# Patient Record
Sex: Female | Born: 1955 | Hispanic: No | Marital: Single | Smoking: Current every day smoker
Health system: Southern US, Community
[De-identification: ages and names within clinical notes are randomized; demographics above are authoritative.]

## PROBLEM LIST (undated history)

## (undated) DIAGNOSIS — G8929 Other chronic pain: Secondary | ICD-10-CM

## (undated) DIAGNOSIS — I10 Essential (primary) hypertension: Secondary | ICD-10-CM

## (undated) DIAGNOSIS — M549 Dorsalgia, unspecified: Secondary | ICD-10-CM

## (undated) DIAGNOSIS — I2510 Atherosclerotic heart disease of native coronary artery without angina pectoris: Secondary | ICD-10-CM

## (undated) DIAGNOSIS — E785 Hyperlipidemia, unspecified: Secondary | ICD-10-CM

## (undated) DIAGNOSIS — F329 Major depressive disorder, single episode, unspecified: Secondary | ICD-10-CM

## (undated) DIAGNOSIS — F32A Depression, unspecified: Secondary | ICD-10-CM

## (undated) DIAGNOSIS — I700 Atherosclerosis of aorta: Secondary | ICD-10-CM

## (undated) DIAGNOSIS — R7303 Prediabetes: Secondary | ICD-10-CM

## (undated) HISTORY — DX: Atherosclerotic heart disease of native coronary artery without angina pectoris: I25.10

## (undated) HISTORY — PX: ABDOMINAL HYSTERECTOMY: SHX81

## (undated) HISTORY — PX: WRIST FRACTURE SURGERY: SHX121

## (undated) HISTORY — DX: Prediabetes: R73.03

## (undated) HISTORY — DX: Atherosclerosis of aorta: I70.0

## (undated) HISTORY — DX: Hyperlipidemia, unspecified: E78.5

---

## 1998-09-19 ENCOUNTER — Other Ambulatory Visit: Admission: RE | Admit: 1998-09-19 | Discharge: 1998-09-19 | Payer: Self-pay | Admitting: Obstetrics and Gynecology

## 1999-12-13 ENCOUNTER — Other Ambulatory Visit: Admission: RE | Admit: 1999-12-13 | Discharge: 1999-12-13 | Payer: Self-pay | Admitting: Obstetrics and Gynecology

## 2000-01-11 ENCOUNTER — Encounter: Payer: Self-pay | Admitting: Obstetrics and Gynecology

## 2000-01-15 ENCOUNTER — Inpatient Hospital Stay (HOSPITAL_COMMUNITY): Admission: RE | Admit: 2000-01-15 | Discharge: 2000-01-18 | Payer: Self-pay | Admitting: Obstetrics and Gynecology

## 2000-01-15 ENCOUNTER — Encounter (INDEPENDENT_AMBULATORY_CARE_PROVIDER_SITE_OTHER): Payer: Self-pay | Admitting: Specialist

## 2000-07-14 ENCOUNTER — Emergency Department (HOSPITAL_COMMUNITY): Admission: EM | Admit: 2000-07-14 | Discharge: 2000-07-14 | Payer: Self-pay | Admitting: Emergency Medicine

## 2000-07-17 ENCOUNTER — Encounter: Admission: RE | Admit: 2000-07-17 | Discharge: 2000-07-17 | Payer: Self-pay | Admitting: Internal Medicine

## 2000-07-17 ENCOUNTER — Encounter: Payer: Self-pay | Admitting: Internal Medicine

## 2002-01-01 ENCOUNTER — Ambulatory Visit (HOSPITAL_COMMUNITY): Admission: RE | Admit: 2002-01-01 | Discharge: 2002-01-01 | Payer: Self-pay | Admitting: Gastroenterology

## 2004-05-12 ENCOUNTER — Emergency Department (HOSPITAL_COMMUNITY): Admission: EM | Admit: 2004-05-12 | Discharge: 2004-05-12 | Payer: Self-pay | Admitting: Family Medicine

## 2004-05-21 ENCOUNTER — Emergency Department (HOSPITAL_COMMUNITY): Admission: EM | Admit: 2004-05-21 | Discharge: 2004-05-21 | Payer: Self-pay | Admitting: Family Medicine

## 2005-01-22 ENCOUNTER — Emergency Department (HOSPITAL_COMMUNITY): Admission: EM | Admit: 2005-01-22 | Discharge: 2005-01-22 | Payer: Self-pay | Admitting: Family Medicine

## 2005-08-29 ENCOUNTER — Emergency Department (HOSPITAL_COMMUNITY): Admission: EM | Admit: 2005-08-29 | Discharge: 2005-08-29 | Payer: Self-pay | Admitting: Family Medicine

## 2006-01-28 ENCOUNTER — Ambulatory Visit: Payer: Self-pay | Admitting: Internal Medicine

## 2006-09-05 ENCOUNTER — Encounter: Admission: RE | Admit: 2006-09-05 | Discharge: 2006-09-05 | Payer: Self-pay | Admitting: General Practice

## 2007-03-05 ENCOUNTER — Emergency Department: Payer: Self-pay | Admitting: Emergency Medicine

## 2007-09-30 ENCOUNTER — Emergency Department (HOSPITAL_COMMUNITY): Admission: EM | Admit: 2007-09-30 | Discharge: 2007-09-30 | Payer: Self-pay | Admitting: Family Medicine

## 2008-01-23 ENCOUNTER — Encounter: Admission: RE | Admit: 2008-01-23 | Discharge: 2008-01-23 | Payer: Self-pay | Admitting: Internal Medicine

## 2008-11-16 ENCOUNTER — Emergency Department (HOSPITAL_COMMUNITY): Admission: EM | Admit: 2008-11-16 | Discharge: 2008-11-17 | Payer: Self-pay | Admitting: Emergency Medicine

## 2009-02-02 ENCOUNTER — Encounter: Admission: RE | Admit: 2009-02-02 | Discharge: 2009-02-02 | Payer: Self-pay | Admitting: Neurosurgery

## 2009-07-04 ENCOUNTER — Emergency Department (HOSPITAL_COMMUNITY): Admission: EM | Admit: 2009-07-04 | Discharge: 2009-07-04 | Payer: Self-pay | Admitting: Emergency Medicine

## 2010-06-25 ENCOUNTER — Encounter: Payer: Self-pay | Admitting: Internal Medicine

## 2010-08-24 ENCOUNTER — Emergency Department (HOSPITAL_COMMUNITY)
Admission: EM | Admit: 2010-08-24 | Discharge: 2010-08-24 | Disposition: A | Discharge status: Home / Self Care | Payer: 59 | Attending: Emergency Medicine | Admitting: Emergency Medicine

## 2010-08-24 DIAGNOSIS — K0510 Chronic gingivitis, plaque induced: Secondary | ICD-10-CM | POA: Insufficient documentation

## 2010-08-24 DIAGNOSIS — I10 Essential (primary) hypertension: Secondary | ICD-10-CM | POA: Insufficient documentation

## 2010-08-24 DIAGNOSIS — K089 Disorder of teeth and supporting structures, unspecified: Secondary | ICD-10-CM | POA: Insufficient documentation

## 2010-08-24 DIAGNOSIS — F17200 Nicotine dependence, unspecified, uncomplicated: Secondary | ICD-10-CM | POA: Insufficient documentation

## 2010-08-24 DIAGNOSIS — R599 Enlarged lymph nodes, unspecified: Secondary | ICD-10-CM | POA: Insufficient documentation

## 2010-10-20 NOTE — Op Note (Signed)
[facility]  Patient:    Judy Nguyen, Judy Nguyen                 MRN: [id number] Attending:  Rande Brunt. Eda Paschal, M.D.                           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  HISTORY OF PRESENT ILLNESS:  The patient is a 55 year old gravida 2, para 1, AB2 with a long progressive history of severe dysmenorrhea and menorrhagia. In addition to the above, she is also having pain in the right lower quadrant which appears to be related to her myomas.  She says that she notices the pain on the right depending on the position she lays in in bed.  She has undergone sonohystogram which reveals multiple leiomyoma uteri.  However, with nothing in the cavity such as a submucous myoma contributing to the above.  She had a GI workup by Dr. Ritta Slot which did not help elucidate the source of her pain.  She has started oral contraceptives in the past with some relief, but she is a smoker and obviously cannot stand oral contraceptives at this point. She has had a previous tubal ligation.  Therefore, she does not desire future pregnancy.  She is using Percocet and Darvocet-N 100 without much success from the above.  As a result of the myomas which enlarge her uterus to three times normal size, especially with the dysmenorrhea and menorrhagia, she now enters the hospital for total abdominal hysterectomy. The patient has given me permission to remove one or both ovaries for significant disease.  At one point, she had talked about having bilateral salpingo-oophorectomy to prevent reoperation for additional ovarian disease or ovarian cancer but, after a long discussion the day before the surgery, she has elected to only have her ovaries removed for significant disease, understanding that there is some risk of leaving ______ ovarian cancer in the future, but understanding the considerable benefit at 43 of  preserving her own ovaries and the fact that hormone replacement does not always resolve symptoms, and also may not be as protective as her own ovaries in terms of cardiovascular disease.  PAST MEDICAL HISTORY: 1. The patient has had previous tubal ligation. 2. History of ulcers.  CURRENT MEDICATIONS:  Percocet and nonsteroidals as well as Darvocet-N 100.  ALLERGIES:  SULFA.  FAMILY HISTORY:  Her mother is diabetic.  Her mother and father are both hypertensive.  SOCIAL HISTORY:  She is a smoker.  She uses alcohol occasionally.  She also drinks caffeine.  REVIEW OF SYSTEMS:  HEENT: Positive for headaches and neck stiffness. CARDIAC: Negative.  RESPIRATORY: Negative.  GASTROINTESTINAL: History of abdominal pain, especially in the right lower quadrant. Also, a history of ulcers.  MUSCULOSKELETAL: history of muscular cramping and pain. GENITOURINARY: negative except for previous urinary tract infections. NEUROLOGIC/PSYCHIATRIC: History of anxiety.  ALLERGIC, IMMUNOLOGIC, LYMPHATIC AND ENDOCRINE: Negative.  PHYSICAL EXAMINATION:  GENERAL:  Well-developed, well-nourished female in no acute distress.  VITAL SIGNS:  Blood pressure 130/80, pulse 80 and regular, respirations 16 and unlabored.  She is afebrile.  HEENT:  Within normal limits.  NECK:  Supple. Trachea in the midline.  Thyroid not enlarged.  LUNGS:  Clear to auscultation and percussion.  HEART:  No thrills, heaves or murmurs.  BREASTS: No masses.  ABDOMEN:  Soft without guarding, rebound or masses.  PELVIC:  External vaginal within normal  limits.  The cervix is clean.  Pap smear showed no atypia.  The uterus is enlarged by myomas to three times normal size.  Total size of the uterus is approximately 11 weeks.  There are at least two very distinct myomas palpable, one of about 4 cm and one of about 2 cm.  Adnexa were palpably normal.  RECTAL:  Negative.  EXTREMITIES:  Within normal limits.  ADMISSION  IMPRESSION:  Symptomatic fibroids with dysmenorrhea, menorrhagia and right lower quadrant pain.  PLAN:  Exploratory laparotomy with total abdominal hysterectomy and possible USO or BSO for significant disease. DD:  01/14/00 TD:  01/15/00 Job: [id number]

## 2010-10-20 NOTE — Assessment & Plan Note (Signed)
[facility]                             [place] OFFICE NOTE   Rumi, Kolodziej AUNDRIA BITTERMAN                 MRN:          [id number]  DATE:01/28/2006                            DOB:          March 06, 1956    CHIEF COMPLAINT:  A 55 year old female here to establish new doctor.   HISTORY OF PRESENT ILLNESS:  [name] comes to clinic today to establish a  new doctor as well as to get a refill of her chronic pain medication.  She  states that she left [facility] following some disagreements with  her previous doctor, Dr. Olena Leatherwood.  She states that she had a good working  relationship with Dr. Merril Abbe in the past, but did not seem to get along  with Dr. Olena Leatherwood.  She also reports that over the past few years, she has  had chronic abdominal pain, right wrist pain, bilateral knee pain and left  hip pain.  Dr. Merril Abbe had her on Vicodin 7.5/750 mg p.o. nightly as well  as an NSAID.  She states that this controls her pain fairly well and she  uses one nightly with sometimes an additional one in the morning.  She has  had an extensive work-up in the past for her abdominal pain including  endoscopy, colonoscopy, partial hysterectomy.  In addition, she has had  surgery on her right wrist and Dr. Farris Has and Dr. Mina Marble have stated that  there is no further surgery that can be done to eliminate her pain and they  are unclear as to why she continues to have pain in her right wrist.  She  also has history of a hip fracture in her left hip in 1997 and has had  chronic pain in that hip since.  She has had MRIs of her knees which showed  no pathology other than mild chondromalacia but she continues to have  significant pain in her knees.  Today she states that I don't do well with  sitting with pain, I need medication to control my pain.   PAST MEDICAL HISTORY:  1. Chronic pain in back, hip, abdomen and right wrist as well as knees.  2. History of  endometriosis.  3. Hypertension.  4. Tobacco abuse.  5. History of being discharged from [facility] for receiving      pain medication from multiple M.D.'s although the patient states that      this was a misunderstanding and has insurance information to prove that      she was not requesting more medication that she was suppose to.   HOSPITALIZATIONS/SURGERIES/PROCEDURES:  1. In 1997, left hip fracture.  2. G2, P1.  3. In 2001, partial hysterectomy for fibroids and endometriosis.  4. January 2006, left knee MRI showed mild chondromalacia.  5. February 2006, right wrist showed tenosynovitis, scaphoid lunate      ligament injury and degenerative TFCC.  6. March 2006, right wrist surgery.  7. October 2006, echocardiogram  with EF 60% and no valvular damage.  8. In 2003, endoscopy and colonoscopy negative.  9. Mammogram 2001, normal.  10.Pap smear in 2003,  was negative.  11.Cholesterol panel, LDL 145, HDL 55, in September 2006.  12.Tetanus in 2005.   ALLERGIES:  SULFA causes rash and difficulty breathing.   MEDICATIONS:  1. Hydrochlorothiazide 25 mg daily.  2. Vicodin ES 7.5/750 mg p.o. nightly.   FAMILY HISTORY:  Father alive at age 50 with diabetes, hypertension and  cataracts.  Mother alive at age 67 with diabetes, congestive heart failure,  CVA, coronary artery disease and sleep apnea.  There is a strong family  history for coronary artery disease, high blood pressure and diabetes.  Her  daughter may have uterine cancer, recently diagnosed.  There is no family  history of breast, ovarian or colon cancer.  There is no past family history  of alcohol or drug abuse.   SOCIAL HISTORY:  She works at the [employer].  The  exercise she gets is by walking at work.  She is divorced but is not  sexually active.   NUTRITION:  She eats limited fried foods and does not eat three meals a day  consistently.  She does get some water daily.   PHYSICAL  EXAMINATION:  GENERAL APPEARANCE:  Overweight appearing female in  no apparent distress, frustrated and somewhat argumentative about chronic  pain and pain medication.  VITAL SIGNS:  Height 5 feet 3-1/2 inches, weight 214 pounds, making BMI 37.  Blood pressure 132/80, pulse 85, temperature 98.3.  HEENT:  PERRLA.  Extraocular muscles intact.  Nares clear.  Oropharynx  clear.  Tympanic membranes clear.  NECK:  No thyromegaly.  No lymphadenopathy, cervical or supraclavicular.  PULMONARY:  Clear to auscultation bilaterally.  No wheezing, rhonchi or  rales.  CARDIOVASCULAR:  Regular rate and rhythm, 2/6 systolic murmur heard greatest  at left upper sternal border.  ABDOMEN:  Soft, mild tenderness to palpation diffusely, no rebound, no  guarding, no hepatosplenomegaly.  EXTREMITIES:  No cyanosis, no clubbing.  NEUROLOGIC:  Cranial nerves II-XII grossly intact.  Alert and oriented x3.  MUSCULOSKELETAL:  Strength 5/5 in upper and lower extremities, 2+ reflexes  bilaterally.  Negative straight leg.  Hip Pearlean Brownie negative but did have some  buttock tenderness with Faber's, slightly decreased range of motion in  bilateral hips, knees no crepitus, negative Lachman's, negative McMurray  bilaterally.  Gait nonantalgic.  BACK:  Tender to palpation over central lower back as well as over  paraspinous muscles bilaterally.   ASSESSMENT/PLAN:  1. Chronic pain:  [name] has been dealing with chronic pain for what      seems like several years.  She has pain in her joints, back, as well as      in her abdomen that seemed to have had a full work-up as well as      several surgeries.  She has not had any improvement with her pain      despite these surgeries. She is on chronic Vicodin 7.5/750 mg b.i.d.      We discussed chronic pain and appropriate management of chronic pain.      I will review her records from her previous doctors.  I feel that she     would be best served at a chronic pain center.  I did  strongly      recommend exercise and weight loss to improve her pain.  Following      reviewing her records, I will give her a temperature prescription for      Vicodin to last until she is seen at the chronic pain  center.  In the      meantime, also we can consider trying other medications such as      Lidoderm patch, Cymbalta or Lyrica.  2. Hypertension, well controlled:  She will continue on      hydrochlorothiazide.  3. Prevention:  She will not need a Pap smear because she has had a      hysterectomy.  She is up to date with other screening but will be due      for a cholesterol screen in September 2007.  She will return at      approximately that time for a follow-up of her pain as well as to      evaluate her cholesterol.                                   Kerby Nora, MD   AB/MedQ  DD:  01/28/2006  DT:  01/29/2006  Job #:  [id number]

## 2010-10-20 NOTE — Op Note (Signed)
   NAME:  NASHGenora, Arp                    ACCOUNT NO.:  [id number]   MEDICAL RECORD NO.:  [id number]                   PATIENT TYPE:  AMB   LOCATION:  ENDO                                 FACILITY:  [facility]   PHYSICIAN:  Charolett Bumpers, M.D.             DATE OF BIRTH:  Feb 17, 1956   DATE OF PROCEDURE:  01/01/2002  DATE OF DISCHARGE:  01/01/2002                                 OPERATIVE REPORT   PROCEDURE:  Colonoscopy.   PROCEDURE INDICATION:  The patient is a 55 year old female, born March 14, 1956.  The patient has unexplained chronic abdominal pain associated  with a normal CT scan of the abdomen.   ENDOSCOPIST:  Charolett Bumpers, M.D.   PREMEDICATION:  Versed 10 mg, fentanyl 100 mcg.   ENDOSCOPE:  Olympus pediatric colonoscope.   DESCRIPTION OF PROCEDURE:  After obtaining informed consent, the patient was  placed in the left lateral decubitus position.  I administered intravenous  fentanyl and intravenous Versed to achieve conscious sedation for the  procedure.  The patient's blood pressure, oxygen saturation and cardiac  rhythm were monitored throughout the procedure and documented in the medical  record.   Anal inspection was normal.  Digital rectal exam was normal.  The Olympus  pediatric video colonoscope was introduced into the rectum and advanced to  the cecum.  Colonic preparation for the exam today was excellent.   Rectum normal.  Sigmoid colon and descending colon normal.  Splenic flexure  normal.  Transverse colon normal.  Hepatic flexure normal.  Ascending colon  normal.  Cecum and ileocecal valve normal.   ASSESSMENT:  Normal proctocolonoscopy to the cecum.  No endoscopic evidence  for the presence of colorectal neoplasia or inflammatory bowel disease.                                               Charolett Bumpers, M.D.    MKJ/MEDQ  D:  01/01/2002  T:  01/07/2002  Job:  [id number]   cc:   Barbette Hair. Merril Abbe, M.D.

## 2010-10-20 NOTE — Op Note (Signed)
[facility]  Patient:    Judy Nguyen, Judy Nguyen                 MRN: [id number] Proc. Date: 01/15/00 Adm. Date:  [id number] Attending:  Sharon Mt                           Operative Report  PREOPERATIVE DIAGNOSES:  Dysmenorrhea, menorrhagia, leiomyoma.  POSTOPERATIVE DIAGNOSES:  Dysmenorrhea, menorrhagia, leiomyoma, endometriosis and multiple paraovarian cysts.  OPERATION PERFORMED:  Total abdominal hysterectomy, excision of paraovarian cyst bilaterally with partial salpingectomy on right fallopian tube.  SURGEON:  Dr. Eda Paschal.  FIRST ASSISTANT:  Dr. Penni Homans.  ANESTHESIA:  General endotracheal.  FINDINGS:  At the time of laparotomy, the patient had multiple small leiomyoma enlarging the uterus. There was an area of active endometriosis near the right round ligament. The ovaries with healthy without adhesive disease or endometriosis. 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 She had just 1 on the large pedicle. They were large with some as large as 3 cm. The ileocecal junction was identified. The appendix could not be seen. It was probably retrocecal. There was no history of her having had it surgically removed. The rest of the exploration of the abdomen was normal.  DESCRIPTION OF PROCEDURE:  After adequate general endotracheal anesthesia, the patient was placed in the supine position, prepped and draped in the usual sterile manner. A Foley catheter was inserted into the patients bladder. A Pfannenstiel incision was made. The fascia was opened transversely. The peritoneum was entered vertically, subcutaneous bleeders were clamped and bovied as encountered. When the peritoneal cavity was opened, the above findings were noted. The round ligaments were clamped, cut and suture ligated with #1 chromic catgut. The vesicouterine fold of the peritoneum and posterior peritoneum were sharply  dissected free. The utero-ovarian ligaments, round ligaments and fallopian tubes were bilaterally clamped, cut and doubly suture ligated with #1 chromic catgut.  Incorporated with the uterus so that it would not remain was the small area of endometriosis on the right round ligament and therefore this went with the specimen. The bladder flap was advanced distally and then the uterine arteries were clamped, cut and doubly suture ligated with #1 chromic catgut. The cardinal ligaments were clamped, cut and suture ligated with #1 chromic catgut until the cervicovaginal junction was identified.  The cervix was trimmed around the uterus was delivered intact and sent to pathology for tissue diagnosis. The uterosacral ligaments were incorporated in the cardinal ligament bites. The vaginal angles were then sutured with #1 chromic incorporating the uterosacral ligaments and perimetrium for good vault support. The cuff was closed with 2 figure-of-eights of #1 chromic catgut. The hydatid cysts were removed bilaterally. On the right, this entailed doing a partial salpingectomy and on the left it just entailed clamping the pedicle off the hydatid. A 3-0 Vicryl was used for suture material on both sides. Copious irrigation was done with Ringers lactated. Two sponge, needle and instrument counts were correct. The peritoneum was closed with the #0 Vicryl. Sub and superfascial spaces were copiously irrigated with Ringers lactate. The fascia was then closed with 2 running #0 Vicryl and the skin was closed with staples. Estimated blood loss for the entire procedure was 100 cc with none replaced. The patient tolerated the procedure well and left the operating room in satisfactory condition draining clear urine from her Foley catheter. DD:  01/15/00 TD:  01/15/00 Job: [id number]

## 2010-10-20 NOTE — Discharge Summary (Signed)
[facility]  Patient:    Judy Nguyen, Judy Nguyen                 MRN: [id number] Adm. Date:  [id number] Disc. Date: [id number] Attending:  Sharon Mt                           Discharge Summary  REASON FOR ADMISSION:  The patient is a 55 year old female with pelvic pain, dysmenorrhea, menorrhagia, who entered the hospital for total abdominal hysterectomy.  HOSPITAL COURSE:  On the day of admission, she was taken to the operating room and findings were small myomas, endometriosis of the serosa of the uterus, and multiple paraovarian cysts.  She underwent a total abdominal hysterectomy, excision of paraovarian cysts bilaterally with a partial salpingectomy on the right fallopian tube to help expediate removal of the paraovarian cysts. Postoperatively she had a mild ileus that responded to IV fluids, restricting p.o., and Dulcolax and by the third postoperative day, she had had a bowel movement.  At that point, she was ready for discharge.  DISCHARGE MEDICATIONS:   Percocet for pain relief, incentive spirometry to continue to expand her lungs as she had had some atelectasis after surgery.  DISCHARGE DIET:  Soft to advanced, as tolerated.  DISCHARGE ACTIVITY:  Ambulatory.  WOUND CARE:  Routine.  FINAL PATHOLOGY REPORT:  Revealed uterus with focal squamous metaplasia of the cervix, benign endocervical inclusion cyst, mild chronic cervicitis, leiomyoma uteri, benign right fallopian tube with benign peritubal cysts, area of endometriosis seen at the time of surgery was not described.  CONDITION ON DISCHARGE:  Improved.  DISCHARGE DIAGNOSES: 1. Dysmenorrhea. 2. Menorrhagia. 3. Leiomyomata uteri. 4. Endometriosis. 5. Paraovarian cyst.  OPERATION:  Total abdominal hysterectomy, excision of paraovarian cyst bilaterally with partial salpingectomy on the right. DD:  01/18/00 TD:  01/19/00 Job: [id number]

## 2010-11-08 ENCOUNTER — Emergency Department (HOSPITAL_COMMUNITY): Payer: 59

## 2010-11-08 ENCOUNTER — Emergency Department (HOSPITAL_COMMUNITY)
Admission: EM | Admit: 2010-11-08 | Discharge: 2010-11-08 | Disposition: A | Discharge status: Home / Self Care | Payer: 59 | Attending: Emergency Medicine | Admitting: Emergency Medicine

## 2010-11-08 DIAGNOSIS — R059 Cough, unspecified: Secondary | ICD-10-CM | POA: Insufficient documentation

## 2010-11-08 DIAGNOSIS — J40 Bronchitis, not specified as acute or chronic: Secondary | ICD-10-CM | POA: Insufficient documentation

## 2010-11-08 DIAGNOSIS — F17200 Nicotine dependence, unspecified, uncomplicated: Secondary | ICD-10-CM | POA: Insufficient documentation

## 2010-11-08 DIAGNOSIS — IMO0002 Reserved for concepts with insufficient information to code with codable children: Secondary | ICD-10-CM | POA: Insufficient documentation

## 2010-11-08 DIAGNOSIS — R05 Cough: Secondary | ICD-10-CM | POA: Insufficient documentation

## 2010-11-08 DIAGNOSIS — M4800 Spinal stenosis, site unspecified: Secondary | ICD-10-CM | POA: Insufficient documentation

## 2010-11-08 DIAGNOSIS — I10 Essential (primary) hypertension: Secondary | ICD-10-CM | POA: Insufficient documentation

## 2010-11-23 ENCOUNTER — Emergency Department (HOSPITAL_COMMUNITY)
Admission: EM | Admit: 2010-11-23 | Discharge: 2010-11-23 | Discharge status: Against Medical Advice | Payer: 59 | Attending: Emergency Medicine | Admitting: Emergency Medicine

## 2010-11-23 ENCOUNTER — Inpatient Hospital Stay (INDEPENDENT_AMBULATORY_CARE_PROVIDER_SITE_OTHER)
Admission: RE | Admit: 2010-11-23 | Discharge: 2010-11-23 | Disposition: A | Discharge status: Home / Self Care | Payer: 59 | Source: Ambulatory Visit | Attending: Family Medicine | Admitting: Family Medicine

## 2010-11-23 DIAGNOSIS — Z0389 Encounter for observation for other suspected diseases and conditions ruled out: Secondary | ICD-10-CM | POA: Insufficient documentation

## 2010-11-23 DIAGNOSIS — R51 Headache: Secondary | ICD-10-CM

## 2011-05-25 ENCOUNTER — Other Ambulatory Visit: Payer: Self-pay | Admitting: General Practice

## 2011-05-25 DIAGNOSIS — M545 Low back pain, unspecified: Secondary | ICD-10-CM

## 2011-05-30 ENCOUNTER — Other Ambulatory Visit: Payer: 59

## 2011-09-16 ENCOUNTER — Emergency Department (HOSPITAL_COMMUNITY)
Admission: EM | Admit: 2011-09-16 | Discharge: 2011-09-16 | Disposition: A | Discharge status: Home / Self Care | Payer: 59 | Attending: Emergency Medicine | Admitting: Emergency Medicine

## 2011-09-16 ENCOUNTER — Encounter (HOSPITAL_COMMUNITY): Payer: Self-pay | Admitting: Emergency Medicine

## 2011-09-16 DIAGNOSIS — M545 Low back pain, unspecified: Secondary | ICD-10-CM | POA: Insufficient documentation

## 2011-09-16 DIAGNOSIS — F3289 Other specified depressive episodes: Secondary | ICD-10-CM | POA: Insufficient documentation

## 2011-09-16 DIAGNOSIS — J45909 Unspecified asthma, uncomplicated: Secondary | ICD-10-CM | POA: Insufficient documentation

## 2011-09-16 DIAGNOSIS — R209 Unspecified disturbances of skin sensation: Secondary | ICD-10-CM | POA: Insufficient documentation

## 2011-09-16 DIAGNOSIS — M549 Dorsalgia, unspecified: Secondary | ICD-10-CM

## 2011-09-16 DIAGNOSIS — F17200 Nicotine dependence, unspecified, uncomplicated: Secondary | ICD-10-CM | POA: Insufficient documentation

## 2011-09-16 DIAGNOSIS — G8929 Other chronic pain: Secondary | ICD-10-CM | POA: Insufficient documentation

## 2011-09-16 DIAGNOSIS — I10 Essential (primary) hypertension: Secondary | ICD-10-CM | POA: Insufficient documentation

## 2011-09-16 DIAGNOSIS — F329 Major depressive disorder, single episode, unspecified: Secondary | ICD-10-CM | POA: Insufficient documentation

## 2011-09-16 DIAGNOSIS — Z79899 Other long term (current) drug therapy: Secondary | ICD-10-CM | POA: Insufficient documentation

## 2011-09-16 HISTORY — DX: Dorsalgia, unspecified: M54.9

## 2011-09-16 HISTORY — DX: Depression, unspecified: F32.A

## 2011-09-16 HISTORY — DX: Major depressive disorder, single episode, unspecified: F32.9

## 2011-09-16 HISTORY — DX: Essential (primary) hypertension: I10

## 2011-09-16 HISTORY — DX: Other chronic pain: G89.29

## 2011-09-16 MED ORDER — DIAZEPAM 5 MG/ML IJ SOLN
5.0000 mg | Freq: Once | INTRAMUSCULAR | Status: AC
  Administered 2011-09-16: 5 mg via INTRAMUSCULAR
  Filled 2011-09-16: qty 2

## 2011-09-16 MED ORDER — HYDROMORPHONE HCL PF 1 MG/ML IJ SOLN
1.0000 mg | Freq: Once | INTRAMUSCULAR | Status: AC
  Administered 2011-09-16: 1 mg via INTRAMUSCULAR
  Filled 2011-09-16: qty 1

## 2011-09-16 NOTE — ED Provider Notes (Signed)
History     CSN: [id number]  Arrival date & time 09/16/11  4098   First MD Initiated Contact with Patient 09/16/11 2058      Chief Complaint  Patient presents with  . Back Pain  . Addiction Problem    family states pt is unable to care for self related to abusing narcotic medications. pt denies abuse of medications.     (Consider location/radiation/quality/duration/timing/severity/associated sxs/prior treatment) HPI Comments: Patient here with a history of chronic lower back pain due to multilevel spinal stenosis presents with three-day history of worsening pain. States that the pain is worse than her chronic pain. Denies any injury to the area. States that the pain radiates down both legs. Reports some numbness and tingling to the left thigh which is chronic. Denies any additional weakness numbness, tingling or, loss of control of bowels or bladder. States she has been under a lot of stress lately which has exacerbated this pain. Has been taking hydrocodone, Valium, tramadol for the pain without relief. States that she has a pain, contract. Reports that her daughter who is here from [place] is concerned that she is abusing narcotics which she denies.  Patient is a 56 y.o. female presenting with back pain. The history is provided by the patient. No language interpreter was used.  Back Pain  This is a chronic problem. The current episode started more than 1 week ago. The problem occurs constantly. The problem has not changed since onset.The pain is associated with no known injury. The pain is present in the lumbar spine. The quality of the pain is described as stabbing. The pain does not radiate. The pain is at a severity of 10/10. The pain is severe. The symptoms are aggravated by bending, twisting and certain positions. The pain is the same all the time. Stiffness is present all day. Associated symptoms include paresthesias. Pertinent negatives include no chest pain, no fever, no numbness,  no weight loss, no headaches, no abdominal pain, no abdominal swelling, no bowel incontinence, no perianal numbness, no bladder incontinence, no dysuria, no pelvic pain, no leg pain, no paresis, no tingling and no weakness. She has tried muscle relaxants and analgesics for the symptoms. The treatment provided no relief. Risk factors include obesity.    Past Medical History  Diagnosis Date  . Chronic back pain   . Hypertension   . Depression   . Asthma     No past surgical history on file.  No family history on file.  History  Substance Use Topics  . Smoking status: Current Everyday Smoker -- 2.0 packs/day    Types: Cigarettes  . Smokeless tobacco: Never Used  . Alcohol Use: No    OB History    Grav Para Term Preterm Abortions TAB SAB Ect Mult Living                  Review of Systems  Constitutional: Negative for fever and weight loss.  Cardiovascular: Negative for chest pain.  Gastrointestinal: Negative for abdominal pain and bowel incontinence.  Genitourinary: Negative for bladder incontinence, dysuria and pelvic pain.  Musculoskeletal: Positive for back pain.  Neurological: Positive for paresthesias. Negative for tingling, weakness, numbness and headaches.  All other systems reviewed and are negative.    Allergies  Sulfa drugs cross reactors  Home Medications   Current Outpatient Rx  Name Route Sig Dispense Refill  . DIAZEPAM 10 MG PO TABS Oral Take 10 mg by mouth 2 (two) times daily.     Marland Kitchen  HYDROCODONE-ACETAMINOPHEN 7.5-750 MG PO TABS Oral Take 1 tablet by mouth every 6 (six) hours as needed. Pain.    Marland Kitchen SERTRALINE HCL 100 MG PO TABS Oral Take 100 mg by mouth every morning.     Marland Kitchen TRAMADOL HCL ER 200 MG PO TB24 Oral Take 200 mg by mouth daily.    . TRIAMTERENE-HCTZ 37.5-25 MG PO TABS Oral Take 1 tablet by mouth daily.      BP 136/63  Pulse 80  Temp(Src) 98.2 F (36.8 C) (Oral)  Resp 18  Ht 5\' 4"  (1.626 m)  Wt 205 lb (92.987 kg)  BMI 35.19 kg/m2  SpO2  96%  Physical Exam  Nursing note and vitals reviewed. Constitutional: She is oriented to person, place, and time. She appears well-developed and well-nourished. No distress.  HENT:  Head: Normocephalic and atraumatic.  Right Ear: External ear normal.  Left Ear: External ear normal.  Nose: Nose normal.  Mouth/Throat: Oropharynx is clear and moist. No oropharyngeal exudate.  Eyes: Conjunctivae are normal. Pupils are equal, round, and reactive to light. No scleral icterus.  Neck: Normal range of motion. Neck supple.  Cardiovascular: Normal rate and regular rhythm.  Exam reveals no gallop and no friction rub.   No murmur heard. Pulmonary/Chest: Effort normal and breath sounds normal. No respiratory distress. She has no wheezes. She has no rales. She exhibits no tenderness.  Abdominal: Soft. Bowel sounds are normal. She exhibits no distension. There is no tenderness.  Musculoskeletal: Normal range of motion. She exhibits tenderness. She exhibits no edema.       Pain to palpation lumbar paraspinal muscles, strength intact,  Lymphadenopathy:    She has no cervical adenopathy.  Neurological: She is alert and oriented to person, place, and time. She has normal reflexes. No cranial nerve deficit. She exhibits normal muscle tone. Coordination normal.  Skin: Skin is warm and dry. No rash noted. No erythema. No pallor.  Psychiatric: She has a normal mood and affect. Her behavior is normal. Judgment and thought content normal.    ED Course  Procedures (including critical care time)  Labs Reviewed - No data to display No results found.   Back pain    MDM  Patient with history of chronic back pain presents with worsening of the pain. No new symptoms, concerning symptoms for cauda equina, epidural abscess, epidural hematoma. No new injury. Reports pain relief with medications here, feels comfortable to return home. Will followup with primary care and pain clinic this week.        Izola Price [place], [place] 09/16/11 2214

## 2011-09-16 NOTE — ED Notes (Signed)
family states pt is unable to care for self related to abusing narcotic medications. pt denies abuse of medications. Pt states she has chronic back pain for 13 years.

## 2011-09-16 NOTE — Discharge Instructions (Signed)
Back Pain, Adult °Low back pain is very common. About 1 in 5 people have back pain. The cause of low back pain is rarely dangerous. The pain often gets better over time. About half of people with a sudden onset of back pain feel better in just 2 weeks. About 8 in 10 people feel better by 6 weeks.  °CAUSES °Some common causes of back pain include: °· Strain of the muscles or ligaments supporting the spine.  °· Wear and tear (degeneration) of the spinal discs.  °· Arthritis.  °· Direct injury to the back.  °DIAGNOSIS °Most of the time, the direct cause of low back pain is not known. However, back pain can be treated effectively even when the exact cause of the pain is unknown. Answering your caregiver's questions about your overall health and symptoms is one of the most accurate ways to make sure the cause of your pain is not dangerous. If your caregiver needs more information, he or she may order lab work or imaging tests (X-rays or MRIs). However, even if imaging tests show changes in your back, this usually does not require surgery. °HOME CARE INSTRUCTIONS °For many people, back pain returns. Since low back pain is rarely dangerous, it is often a condition that people can learn to manage on their own.  °· Remain active. It is stressful on the back to sit or stand in one place. Do not sit, drive, or stand in one place for more than 30 minutes at a time. Take short walks on level surfaces as soon as pain allows. Try to increase the length of time you walk each day.  °· Do not stay in bed. Resting more than 1 or 2 days can delay your recovery.  °· Do not avoid exercise or work. Your body is made to move. It is not dangerous to be active, even though your back may hurt. Your back will likely heal faster if you return to being active before your pain is gone.  °· Pay attention to your body when you  bend and lift. Many people have less discomfort when lifting if they bend their knees, keep the load close to their  bodies, and avoid twisting. Often, the most comfortable positions are those that put less stress on your recovering back.  °· Find a comfortable position to sleep. Use a firm mattress and lie on your side with your knees slightly bent. If you lie on your back, put a pillow under your knees.  °· Only take over-the-counter or prescription medicines as directed by your caregiver. Over-the-counter medicines to reduce pain and inflammation are often the most helpful. Your caregiver may prescribe muscle relaxant drugs. These medicines help dull your pain so you can more quickly return to your normal activities and healthy exercise.  °· Put ice on the injured area.  °· Put ice in a plastic bag.  °· Place a towel between your skin and the bag.  °· Leave the ice on for 15 to 20 minutes, 3 to 4 times a day for the first 2 to 3 days. After that, ice and heat may be alternated to reduce pain and spasms.  °· Ask your caregiver about trying back exercises and gentle massage. This may be of some benefit.  °· Avoid feeling anxious or stressed. Stress increases muscle tension and can worsen back pain. It is important to recognize when you are anxious or stressed and learn ways to manage it. Exercise is a great option.  °SEEK MEDICAL CARE IF: °· You have pain that is not relieved with rest or medicine.  °· You have   pain that does not improve in 1 week.  °· You have new symptoms.  °· You are generally not feeling well.  °SEEK IMMEDIATE MEDICAL CARE IF:  °· You have pain that radiates from your back into your legs.  °· You develop new bowel or bladder control problems.  °· You have unusual weakness or numbness in your arms or legs.  °· You develop nausea or vomiting.  °· You develop abdominal pain.  °· You feel faint.  °Document Released: 05/21/2005 Document Revised: 05/10/2011 Document Reviewed: 10/09/2010 °[employer]® Patient Information ©2012 [employer].Chronic Back Pain °When back pain lasts longer than 3 months, it is called  chronic back pain. This pain can be frustrating, but the cause of the pain is rarely dangerous. People with chronic back pain often go through certain periods that are more intense (flare-ups). °CAUSES °Chronic back pain can be caused by wear and tear (degeneration) on different structures in your back. These structures may include bones, ligaments, or discs. This degeneration may result in more pressure being placed on the nerves that travel to your legs and feet. This can lead to pain traveling from the low back down the back of the legs. When pain lasts longer than 3 months, it is not unusual for people to experience anxiety or depression. Anxiety and depression can also contribute to low back pain. °TREATMENT °· Establish a regular exercise plan. This is critical to improving your functional level.  °· Have a self-management plan for when you flare-up. Flare-ups rarely require a medical visit. Regular exercise will help reduce the intensity and frequency of your flare-ups.  °· Manage how you feel about your back pain and the rest of your life. Anxiety, depression, and feeling that you cannot alter your back pain have been shown to make back pain more intense and debilitating.  °· Medicines should never be your only treatment. They should be used along with other treatments to help you return to a more active lifestyle.  °· Procedures such as injections or surgery may be helpful but are rarely necessary. You may be able to get the same results with physical therapy or chiropractic care.  °HOME CARE INSTRUCTIONS °· Avoid bending, heavy lifting, prolonged sitting, and activities which make the problem worse.  °· Continue normal activity as much as possible.  °· Take brief periods of rest throughout the day to reduce your pain during flare-ups.  °· Follow your back exercise rehabilitation program. This can help reduce symptoms and prevent more pain.  °· Only take over-the-counter or prescription medicines as  directed by your caregiver. Muscle relaxants are sometimes prescribed. Narcotic pain medicine is discouraged for long-term pain, since addiction is a possible outcome.  °· If you smoke, quit.  °· Eat healthy foods and maintain a recommended body weight.  °SEEK IMMEDIATE MEDICAL CARE IF:  °· You have weakness or numbness in one of your legs or feet.  °· You have trouble controlling your bladder or bowels.  °· You develop nausea, vomiting, abdominal pain, shortness of breath, or fainting.  °Document Released: 06/28/2004 Document Revised: 05/10/2011 Document Reviewed: 05/05/2011 °[employer]® Patient Information ©2012 [employer]. °

## 2011-09-17 ENCOUNTER — Emergency Department (HOSPITAL_COMMUNITY)
Admission: EM | Admit: 2011-09-17 | Discharge: 2011-09-17 | Disposition: A | Discharge status: Home / Self Care | Payer: 59 | Attending: Emergency Medicine | Admitting: Emergency Medicine

## 2011-09-17 ENCOUNTER — Encounter (HOSPITAL_COMMUNITY): Payer: Self-pay

## 2011-09-17 DIAGNOSIS — F1910 Other psychoactive substance abuse, uncomplicated: Secondary | ICD-10-CM | POA: Insufficient documentation

## 2011-09-17 DIAGNOSIS — J45909 Unspecified asthma, uncomplicated: Secondary | ICD-10-CM | POA: Insufficient documentation

## 2011-09-17 DIAGNOSIS — F32A Depression, unspecified: Secondary | ICD-10-CM

## 2011-09-17 DIAGNOSIS — G8929 Other chronic pain: Secondary | ICD-10-CM | POA: Insufficient documentation

## 2011-09-17 DIAGNOSIS — I10 Essential (primary) hypertension: Secondary | ICD-10-CM | POA: Insufficient documentation

## 2011-09-17 DIAGNOSIS — F17200 Nicotine dependence, unspecified, uncomplicated: Secondary | ICD-10-CM | POA: Insufficient documentation

## 2011-09-17 DIAGNOSIS — M549 Dorsalgia, unspecified: Secondary | ICD-10-CM | POA: Insufficient documentation

## 2011-09-17 DIAGNOSIS — F3289 Other specified depressive episodes: Secondary | ICD-10-CM | POA: Insufficient documentation

## 2011-09-17 DIAGNOSIS — F329 Major depressive disorder, single episode, unspecified: Secondary | ICD-10-CM

## 2011-09-17 LAB — CBC
HCT: 42.2 % (ref 36.0–46.0)
MCH: 29.5 pg (ref 26.0–34.0)
MCHC: 33.6 g/dL (ref 30.0–36.0)
MCV: 87.6 fL (ref 78.0–100.0)
RDW: 13.8 % (ref 11.5–15.5)

## 2011-09-17 LAB — RAPID URINE DRUG SCREEN, HOSP PERFORMED
Benzodiazepines: POSITIVE — AB
Cocaine: NOT DETECTED
Opiates: POSITIVE — AB

## 2011-09-17 LAB — COMPREHENSIVE METABOLIC PANEL
Albumin: 3.9 g/dL (ref 3.5–5.2)
BUN: 11 mg/dL (ref 6–23)
Calcium: 9.5 mg/dL (ref 8.4–10.5)
Creatinine, Ser: 0.73 mg/dL (ref 0.50–1.10)
GFR calc Af Amer: >90 mL/min (ref >90)
Potassium: 3.2 mEq/L — ABNORMAL LOW (ref 3.5–5.1)
Total Protein: 7.9 g/dL (ref 6.0–8.3)

## 2011-09-17 LAB — ETHANOL: Alcohol, Ethyl (B): <11 mg/dL (ref 0–11)

## 2011-09-17 LAB — ACETAMINOPHEN LEVEL: Acetaminophen (Tylenol), Serum: <15 ug/mL (ref 10–30)

## 2011-09-17 NOTE — ED Notes (Signed)
Original IVC paper work given to pysch ED nurse to be placed in book. 3 other copies put in pts chart

## 2011-09-17 NOTE — ED Provider Notes (Signed)
History     CSN: [id number]  Arrival date & time 09/17/11  1645   First MD Initiated Contact with Patient 09/17/11 1758      Chief Complaint  Patient presents with  . Medical Clearance    (Consider location/radiation/quality/duration/timing/severity/associated sxs/prior treatment) HPI Pt has had IVC paperwork completed by family member.   She presents due to family concern for substance abuse and depression.  Family states she is abusing her narcotic pain meds prescribed for chronic pain.  Daughter also notes that her house is in disarray and she is concerned that patient is under too much stress.  Pt states she is going through home foreclosure, has chronic pain but denies abusing her prescription meds, denies suicidal thoughts.  States she has been taking antidepressant as prescribed by her PMD.  Pt's primary complaint is her chronic back pain.    Past Medical History  Diagnosis Date  . Chronic back pain   . Hypertension   . Depression   . Asthma     History reviewed. No pertinent past surgical history.  History reviewed. No pertinent family history.  History  Substance Use Topics  . Smoking status: Current Everyday Smoker -- 2.0 packs/day    Types: Cigarettes  . Smokeless tobacco: Never Used  . Alcohol Use: No    OB History    Grav Para Term Preterm Abortions TAB SAB Ect Mult Living                  Review of Systems ROS reviewed and all otherwise negative except for mentioned in HPI  Allergies  Sulfa drugs cross reactors  Home Medications   Current Outpatient Rx  Name Route Sig Dispense Refill  . DIAZEPAM 10 MG PO TABS Oral Take 10 mg by mouth 2 (two) times daily.     Marland Kitchen HYDROCODONE-ACETAMINOPHEN 7.5-750 MG PO TABS Oral Take 1 tablet by mouth every 6 (six) hours as needed. Pain.    Marland Kitchen SERTRALINE HCL 100 MG PO TABS Oral Take 100 mg by mouth every morning.     Marland Kitchen TRAMADOL HCL ER 200 MG PO TB24 Oral Take 200 mg by mouth daily.    . TRIAMTERENE-HCTZ 37.5-25 MG  PO TABS Oral Take 1 tablet by mouth daily.      BP 143/66  Pulse 86  Temp(Src) 98.6 F (37 C) (Oral)  Resp 20  SpO2 100% Vitals reviewed Physical Exam Physical Examination: General appearance - alert, well appearing, and in no distress Mental status - alert, oriented to person, place, and time Chest - clear to auscultation, no wheezes, rales or rhonchi, symmetric air entry Heart - normal rate, regular rhythm, normal S1, S2, no murmurs, rubs, clicks or gallops Neurological - alert, oriented, normal speech, no focal findings or movement disorder noted Extremities - peripheral pulses normal, no pedal edema, no clubbing or cyanosis Skin - normal coloration and turgor, no rashes Psych- frustrated but cooperative  ED Course  Procedures (including critical care time)  9:48 PM Pt has had telepsych performed, he has rescinded the IVC, and has cleared the patient for discharge.    Labs Reviewed  CBC - Abnormal; Notable for the following:    WBC 10.7 (*)    All other components within normal limits  COMPREHENSIVE METABOLIC PANEL - Abnormal; Notable for the following:    Potassium 3.2 (*)    Glucose, Bld 115 (*)    Total Bilirubin 0.2 (*)    All other components within normal limits  URINE RAPID  DRUG SCREEN (HOSP PERFORMED) - Abnormal; Notable for the following:    Opiates POSITIVE (*)    Benzodiazepines POSITIVE (*)    Tetrahydrocannabinol POSITIVE (*)    All other components within normal limits  ETHANOL  ACETAMINOPHEN LEVEL  PREGNANCY, URINE  POCT PREGNANCY, URINE  LAB REPORT - SCANNED   No results found.   1. Depression   2. Substance abuse       MDM  Pt presenting under IVC papers taken out by her daughter- pt denies being suicidal or abusing her prescription meds.  Telepsych consultation obtained and IVC rescinded.  She has been cleared for discharge- she is to continue sertraline as prescribed by her PMD.          Ethelda Chick, MD 09/22/11 [phone]

## 2011-09-17 NOTE — ED Notes (Signed)
Contact for pt are her 2 daughters-Aisha Yerby at [phone] and Selenia Mihok at [phone]

## 2011-09-17 NOTE — ED Notes (Addendum)
Pt alert and oriented x4. Pt is upset with her daughter because her daughter, pt came to ED yesterday because daughter threatened to take out IVC papers. Pt was dx with chronic back pain. Pt reports that she worked for [employer] for 23 years and then became disabled. Due to chronic back pain pts takes pain medications. Pts house is now in foreclosure due to helping her daughter with her house. Pt admits she has depression due to disablity and losing her house. Pt is also taking care of her ailing mother. Pt reports this is a power control between her daughter and herself.   At present pt is cooperative and compliant. Pt denies SI/ HI auditory or visual hallucinations.   Sheriff still present with IVC papers. 2 daughters present. Daughter took out papers because they report pt is a substance abuser and has list of complaints on IVC papers.

## 2011-09-17 NOTE — Discharge Instructions (Signed)
Return to the ED with any concerns including worsening depression, thoughts or feelings of suicide or homicide, or any other alarming symptoms

## 2011-09-17 NOTE — ED Notes (Signed)
Patient here in sheriffs custody with IVC paperwork. Patient refuses to change in scrubs at triage and uncooperative to process. Patient reports that she was here last night voluntarily and requests an attorney.

## 2011-09-20 NOTE — ED Provider Notes (Signed)
Medical screening examination/treatment/procedure(s) were performed by non-physician practitioner and as supervising physician I was immediately available for consultation/collaboration.  Stanislaw Acton L Renada Cronin, MD 09/20/11 1535 

## 2011-09-24 ENCOUNTER — Ambulatory Visit: Payer: 59 | Attending: General Practice

## 2011-09-24 DIAGNOSIS — M2550 Pain in unspecified joint: Secondary | ICD-10-CM | POA: Insufficient documentation

## 2011-09-24 DIAGNOSIS — M2560 Stiffness of unspecified joint, not elsewhere classified: Secondary | ICD-10-CM | POA: Insufficient documentation

## 2011-09-24 DIAGNOSIS — IMO0001 Reserved for inherently not codable concepts without codable children: Secondary | ICD-10-CM | POA: Insufficient documentation

## 2012-08-27 ENCOUNTER — Emergency Department (HOSPITAL_COMMUNITY)
Admission: EM | Admit: 2012-08-27 | Discharge: 2012-08-27 | Disposition: A | Discharge status: Home / Self Care | Payer: PRIVATE HEALTH INSURANCE | Attending: Emergency Medicine | Admitting: Emergency Medicine

## 2012-08-27 ENCOUNTER — Encounter (HOSPITAL_COMMUNITY): Payer: Self-pay | Admitting: Emergency Medicine

## 2012-08-27 DIAGNOSIS — F329 Major depressive disorder, single episode, unspecified: Secondary | ICD-10-CM | POA: Insufficient documentation

## 2012-08-27 DIAGNOSIS — M5432 Sciatica, left side: Secondary | ICD-10-CM

## 2012-08-27 DIAGNOSIS — M545 Low back pain, unspecified: Secondary | ICD-10-CM | POA: Insufficient documentation

## 2012-08-27 DIAGNOSIS — J45909 Unspecified asthma, uncomplicated: Secondary | ICD-10-CM | POA: Insufficient documentation

## 2012-08-27 DIAGNOSIS — M5430 Sciatica, unspecified side: Secondary | ICD-10-CM | POA: Insufficient documentation

## 2012-08-27 DIAGNOSIS — G8929 Other chronic pain: Secondary | ICD-10-CM

## 2012-08-27 DIAGNOSIS — F3289 Other specified depressive episodes: Secondary | ICD-10-CM | POA: Insufficient documentation

## 2012-08-27 DIAGNOSIS — R269 Unspecified abnormalities of gait and mobility: Secondary | ICD-10-CM | POA: Insufficient documentation

## 2012-08-27 DIAGNOSIS — M549 Dorsalgia, unspecified: Secondary | ICD-10-CM

## 2012-08-27 DIAGNOSIS — Z79899 Other long term (current) drug therapy: Secondary | ICD-10-CM | POA: Insufficient documentation

## 2012-08-27 DIAGNOSIS — I10 Essential (primary) hypertension: Secondary | ICD-10-CM | POA: Insufficient documentation

## 2012-08-27 DIAGNOSIS — F17200 Nicotine dependence, unspecified, uncomplicated: Secondary | ICD-10-CM | POA: Insufficient documentation

## 2012-08-27 MED ORDER — HYDROMORPHONE HCL PF 1 MG/ML IJ SOLN
1.0000 mg | Freq: Once | INTRAMUSCULAR | Status: AC
  Administered 2012-08-27: 1 mg via INTRAVENOUS
  Filled 2012-08-27: qty 1

## 2012-08-27 MED ORDER — DIAZEPAM 5 MG PO TABS
10.0000 mg | ORAL_TABLET | Freq: Once | ORAL | Status: AC
  Administered 2012-08-27: 10 mg via ORAL
  Filled 2012-08-27: qty 2

## 2012-08-27 NOTE — ED Notes (Signed)
Pt states she has "spinal stenosis". Started yesterday with severe lower back pain to left leg. Takes Vicodin and Flexeril but was unable to get relief. Sees. Dr. Renne Crigler. Pt is tearful at this time.

## 2012-08-27 NOTE — ED Provider Notes (Signed)
History    This chart was scribed for non-physician practitioner Dierdre Forth, PA-C working with Joya Gaskins, MD by Gerlean Ren, ED Scribe. This patient was seen in room TR11C/TR11C and the patient's care was started at 5:10 PM.    CSN: [id number]  Arrival date & time 08/27/12  1550   First MD Initiated Contact with Patient 08/27/12 1707      No chief complaint on file.    The history is provided by the patient. No language interpreter was used.  Judy Nguyen is a 57 y.o. female who presents to the Emergency Department complaining of lower back pain that is chronic in nature but began worsening 3 days ago when she pulled her mother who was falling towards her and aggravated her lower back.  Pain has been gradually worsening since that time.  Pain worsened by ambulation.  Pt took Tramadol and Flexeril last night with no improvements.  Pt states upon waking up this afternoon pain was radiating down right lower extremity into right ankle causing cramping pain in right ankle, however this radiation into lower extremities is normal to chronic pain.  Pt denies incontinence of bowel or bladder, numbness, or weakness.  Pt is a current everyday smoker but denies alcohol use.  Pt has seen Dr. Renne Crigler in the past for chronic back pain and pt states she did not call Dr. Renne Crigler this morning because she was in so much pain.    Past Medical History  Diagnosis Date  . Chronic back pain   . Hypertension   . Depression   . Asthma     Past Surgical History  Procedure Laterality Date  . Abdominal hysterectomy    . Wrist fracture surgery Right     No family history on file.  History  Substance Use Topics  . Smoking status: Current Every Day Smoker -- 2.00 packs/day    Types: Cigarettes  . Smokeless tobacco: Never Used  . Alcohol Use: No    No OB history provided.   Review of Systems  Constitutional: Negative for fever and fatigue.  HENT: Negative for neck pain and neck stiffness.    Respiratory: Negative for chest tightness and shortness of breath.   Cardiovascular: Negative for chest pain.  Gastrointestinal: Negative for nausea, vomiting, abdominal pain and diarrhea.  Genitourinary: Negative for dysuria, urgency, frequency and hematuria.  Musculoskeletal: Positive for back pain and gait problem ( 2/2 pain). Negative for joint swelling.  Skin: Negative for rash.  Neurological: Negative for weakness, light-headedness, numbness and headaches.  All other systems reviewed and are negative.    Allergies  Sulfa drugs cross reactors  Home Medications   Current Outpatient Rx  Name  Route  Sig  Dispense  Refill  . cyclobenzaprine (FLEXERIL) 10 MG tablet   Oral   Take 10 mg by mouth daily as needed for muscle spasms. For muscle spasms         . diazepam (VALIUM) 10 MG tablet   Oral   Take 10 mg by mouth 2 (two) times daily.          Marland Kitchen HYDROcodone-acetaminophen (VICODIN ES) 7.5-750 MG per tablet   Oral   Take 1 tablet by mouth every 6 (six) hours as needed. Pain.         . sertraline (ZOLOFT) 100 MG tablet   Oral   Take 100 mg by mouth every morning.          . traMADol (ULTRAM-ER) 200 MG 24 hr  tablet   Oral   Take 200 mg by mouth daily.         Marland Kitchen triamterene-hydrochlorothiazide (MAXZIDE-25) 37.5-25 MG per tablet   Oral   Take 1 tablet by mouth daily.           BP 165/87  Pulse 92  Temp(Src) 97 F (36.1 C) (Oral)  Resp 18  SpO2 97%  Physical Exam  Nursing note and vitals reviewed. Constitutional: She appears well-developed and well-nourished. No distress.  HENT:  Head: Normocephalic and atraumatic.  Mouth/Throat: Oropharynx is clear and moist. No oropharyngeal exudate.  Eyes: Conjunctivae are normal.  Neck: Normal range of motion. Neck supple.  Full ROM without pain  Cardiovascular: Normal rate, regular rhythm and intact distal pulses.   Pulmonary/Chest: Effort normal and breath sounds normal. No respiratory distress. She has no  wheezes.  Abdominal: Soft. She exhibits no distension. There is no tenderness.  Musculoskeletal:  Full range of motion of the T-spine and L-spine No tenderness to palpation of the spinous processes of the T-spine or L-spine Mild tenderness to palpation of the paraspinous muscles of the L-spine bilaterally, left worse than right, tenderness to palpation of the SI joints bilaterally, left worse than right Reproducible sciatic pain on left side.  Lymphadenopathy:    She has no cervical adenopathy.  Neurological: She is alert. She has normal reflexes.  Speech is clear and goal oriented, follows commands Normal strength in upper and lower extremities bilaterally including dorsiflexion and plantar flexion, strong and equal grip strength Sensation normal to light and sharp touch Moves extremities without ataxia, coordination intact Normal gait Normal balance   Skin: Skin is warm and dry. No rash noted. She is not diaphoretic. No erythema.  Psychiatric: She has a normal mood and affect. Her behavior is normal.    ED Course  Procedures (including critical care time) DIAGNOSTIC STUDIES: Oxygen Saturation is 97% on room air, adequate by my interpretation.    COORDINATION OF CARE: 5:18 PM- Patient informed of clinical course including pain relief here.  Advised pt to continue using pain medication at home as prescribed.  Pt understand and agrees with plan.   1. Back pain   2. Chronic back pain   3. Sciatica, left [724.3]       MDM  Judy Nguyen presents with acute on chronic back pain.  Patient with back pain.  No neurological deficits and normal neuro exam.  Patient can walk but states is painful.  No loss of bowel or bladder control.  No concern for cauda equina.  No fever, night sweats, weight loss, h/o cancer, IVDU.  RICE protocol and pain medicine indicated and discussed with patient. Pain controlled here in the department.  I have also discussed reasons to return immediately to the  ER.  Patient expresses understanding and agrees with plan.   1. Medications: usual home medications 2. Treatment: rest, drink plenty of fluids, gentle stretching as discussed, alternate ice and heat 3. Follow Up: Please followup with your primary doctor for discussion of your diagnoses and further evaluation after today's visit; if you do not have a primary care doctor use the resource guide provided to find one;  I personally performed the services described in this documentation, which was scribed in my presence. The recorded information has been reviewed and is accurate.         Dahlia Client Shanette Tamargo, PA-C 08/27/12 2022

## 2012-08-27 NOTE — ED Notes (Signed)
Dilaudid given IM  

## 2012-08-28 NOTE — ED Provider Notes (Signed)
Medical screening examination/treatment/procedure(s) were performed by non-physician practitioner and as supervising physician I was immediately available for consultation/collaboration.   Joya Gaskins, MD 08/28/12 [id number]

## 2014-01-18 ENCOUNTER — Encounter (HOSPITAL_COMMUNITY): Payer: Self-pay | Admitting: Emergency Medicine

## 2014-01-18 ENCOUNTER — Emergency Department (HOSPITAL_COMMUNITY): Payer: PRIVATE HEALTH INSURANCE

## 2014-01-18 ENCOUNTER — Emergency Department (HOSPITAL_COMMUNITY)
Admission: EM | Admit: 2014-01-18 | Discharge: 2014-01-18 | Disposition: A | Discharge status: Home / Self Care | Payer: PRIVATE HEALTH INSURANCE | Attending: Emergency Medicine | Admitting: Emergency Medicine

## 2014-01-18 DIAGNOSIS — S6990XA Unspecified injury of unspecified wrist, hand and finger(s), initial encounter: Secondary | ICD-10-CM | POA: Insufficient documentation

## 2014-01-18 DIAGNOSIS — W19XXXA Unspecified fall, initial encounter: Secondary | ICD-10-CM

## 2014-01-18 DIAGNOSIS — R296 Repeated falls: Secondary | ICD-10-CM | POA: Insufficient documentation

## 2014-01-18 DIAGNOSIS — Z87828 Personal history of other (healed) physical injury and trauma: Secondary | ICD-10-CM | POA: Diagnosis not present

## 2014-01-18 DIAGNOSIS — I10 Essential (primary) hypertension: Secondary | ICD-10-CM | POA: Insufficient documentation

## 2014-01-18 DIAGNOSIS — Y939 Activity, unspecified: Secondary | ICD-10-CM | POA: Diagnosis not present

## 2014-01-18 DIAGNOSIS — S59919A Unspecified injury of unspecified forearm, initial encounter: Secondary | ICD-10-CM

## 2014-01-18 DIAGNOSIS — Z79899 Other long term (current) drug therapy: Secondary | ICD-10-CM | POA: Insufficient documentation

## 2014-01-18 DIAGNOSIS — G8929 Other chronic pain: Secondary | ICD-10-CM | POA: Diagnosis not present

## 2014-01-18 DIAGNOSIS — M79601 Pain in right arm: Secondary | ICD-10-CM

## 2014-01-18 DIAGNOSIS — F3289 Other specified depressive episodes: Secondary | ICD-10-CM | POA: Diagnosis not present

## 2014-01-18 DIAGNOSIS — S59909A Unspecified injury of unspecified elbow, initial encounter: Secondary | ICD-10-CM | POA: Insufficient documentation

## 2014-01-18 DIAGNOSIS — S4980XA Other specified injuries of shoulder and upper arm, unspecified arm, initial encounter: Secondary | ICD-10-CM | POA: Insufficient documentation

## 2014-01-18 DIAGNOSIS — F17200 Nicotine dependence, unspecified, uncomplicated: Secondary | ICD-10-CM | POA: Insufficient documentation

## 2014-01-18 DIAGNOSIS — J45909 Unspecified asthma, uncomplicated: Secondary | ICD-10-CM | POA: Insufficient documentation

## 2014-01-18 DIAGNOSIS — Y929 Unspecified place or not applicable: Secondary | ICD-10-CM | POA: Insufficient documentation

## 2014-01-18 DIAGNOSIS — S46909A Unspecified injury of unspecified muscle, fascia and tendon at shoulder and upper arm level, unspecified arm, initial encounter: Secondary | ICD-10-CM | POA: Insufficient documentation

## 2014-01-18 DIAGNOSIS — F329 Major depressive disorder, single episode, unspecified: Secondary | ICD-10-CM | POA: Diagnosis not present

## 2014-01-18 NOTE — ED Provider Notes (Signed)
CSN: [id number]     Arrival date & time 01/18/14  1805 History  This chart was scribed for non-physician practitioner, [name], PA-C working with Raeford RazorStephen Kohut, MD by Luisa DagoPriscilla Tutu, ED scribe. This patient was seen in room WTR6/WTR6 and the patient's care was started at 7:58 PM.    Chief Complaint  Patient presents with  . Fall   The history is provided by the patient. No language interpreter was used.   HPI Comments: Judy Nguyen is a 58 y.o. female with PMHx of chronic back pain, HTN, depression, asthma presenting to the ED with right arm pain that started yesterday after a fall. Patient reported that she had a mechanical fall where she landed on her right side. Stated that she has been having pain in her right hand, wrist, elbow, and shoulder. Reported that the pain in the hand is localized to the thumb, described as a soreness. Reported that the forearm, elbow, and shoulder pain is a soreness with the shoulder having intermittent cramping without radiation. Stated that the pain worsens with motion. Denied chest pain, shortness of breath, difficulty breathing, head injury, LOC, numbness, tingling, neck pain, neck stiffness, blurred vision, sudden loss of vision, loss of sensation.  PCP Dr. Renne CriglerPharr  Past Medical History  Diagnosis Date  . Chronic back pain   . Hypertension   . Depression   . Asthma    Past Surgical History  Procedure Laterality Date  . Abdominal hysterectomy    . Wrist fracture surgery Right    No family history on file. History  Substance Use Topics  . Smoking status: Current Every Day Smoker -- 2.00 packs/day    Types: Cigarettes  . Smokeless tobacco: Never Used  . Alcohol Use: No   OB History   Grav Para Term Preterm Abortions TAB SAB Ect Mult Living                 Review of Systems  Constitutional: Negative for fever and chills.  HENT: Negative for congestion.   Respiratory: Negative for cough and shortness of breath.   Gastrointestinal:  Negative for vomiting, abdominal pain and diarrhea.  Musculoskeletal: Positive for arthralgias and myalgias.  Neurological: Negative for weakness.  All other systems reviewed and are negative.  Allergies  Sulfa drugs cross reactors  Home Medications   Prior to Admission medications   Medication Sig Start Date End Date Taking? Authorizing Provider  cyclobenzaprine (FLEXERIL) 10 MG tablet Take 10 mg by mouth daily as needed for muscle spasms. For muscle spasms   Yes Historical Provider, MD  diazepam (VALIUM) 10 MG tablet Take 10 mg by mouth 2 (two) times daily.    Yes Historical Provider, MD  HYDROcodone-acetaminophen (NORCO) 10-325 MG per tablet Take 1 tablet by mouth 3 (three) times daily as needed for moderate pain.   Yes Historical Provider, MD  sertraline (ZOLOFT) 100 MG tablet Take 100 mg by mouth every morning.    Yes Historical Provider, MD  traMADol (ULTRAM-ER) 200 MG 24 hr tablet Take 200 mg by mouth daily.   Yes Historical Provider, MD  triamterene-hydrochlorothiazide (MAXZIDE-25) 37.5-25 MG per tablet Take 1 tablet by mouth daily.   Yes Historical Provider, MD   BP 143/71  Pulse 88  Temp(Src) 98.2 F (36.8 C) (Oral)  Resp 20  SpO2 97%  Physical Exam  Nursing note and vitals reviewed. Constitutional: She is oriented to person, place, and time. She appears well-developed and well-nourished. No distress.  HENT:  Head: Normocephalic and  atraumatic.  Right Ear: External ear normal.  Left Ear: External ear normal.  Nose: Nose normal.  Mouth/Throat: Oropharynx is clear and moist. No oropharyngeal exudate.  Negative facial trauma Negative hematomas  Negative crepitus or depression palpated to the skull/maxillary region  Eyes: Conjunctivae and EOM are normal. Pupils are equal, round, and reactive to light. Right eye exhibits no discharge. Left eye exhibits no discharge.  Negative nystagmus Visual fields grossly intact Negative crepitus upon palpation to the orbital Negative  signs of entrapment  Neck: Normal range of motion. Neck supple. No tracheal deviation present.  Negative neck stiffness Negative nuchal rigidity Negative cervical lymphadenopathy Negative pain upon palpation to the c-spine  Cardiovascular: Normal rate, regular rhythm and normal heart sounds.  Exam reveals no friction rub.   No murmur heard. Pulses:      Radial pulses are 2+ on the right side, and 2+ on the left side.  Cap refill less than 3 seconds  Pulmonary/Chest: Effort normal and breath sounds normal. No respiratory distress. She has no wheezes. She has no rales. She exhibits no tenderness.  Musculoskeletal: Normal range of motion. She exhibits tenderness. She exhibits no edema.       Arms: Negative swelling erythema, inflammation, lesions, sores, deformities, malalignments noted to the right hand and wrist. Discomfort upon palpation to the right wrist - localized to the right snuffbox region. Full ROM to the digits of the right hand - full flexion, extension, adduction, abduction. Full flexion, extension noted to the right wrist. Full supination and pronation.   Negative swelling, erythema, inflammation, lesions, sores, deformities noted to the right elbow. Negative warmth upon palpation. Full flexion and extension noted on exam. Negative pain with supination and pronation.   Negative swelling, erythema, inflammation, lesions, sores, deformities, malalignments noted to the right shoulder. Negative sunken in appearance. Negative tenting of the clavicles. Full ROM noted with flexion, extension, inversion, eversion, adduction, and abduction noted with no difficulty or ataxia noted.   Full ROM to upper and lower extremities without difficulty noted, negative ataxia noted.  Lymphadenopathy:    She has no cervical adenopathy.  Neurological: She is alert and oriented to person, place, and time. No cranial nerve deficit. She exhibits normal muscle tone. Coordination normal.  Cranial nerves  III-XII grossly intact Strength 5+/5+ to upper and lower extremities bilaterally with resistance applied, equal distribution noted Sensation intact with differentiation sharp and dull touch Equal grip strength Negative facial drooping Negative slurred speech Negative aphasia Strength intact to MCP, PIP, DIP joints of right hands Negative arm drift Fine motor skills intact Heel to knee down shin normal bilaterally Gait proper, proper balance - negative sway, negative drift, negative step-offs  Skin: Skin is warm and dry. No rash noted. She is not diaphoretic. No erythema.  Psychiatric: She has a normal mood and affect. Her behavior is normal. Thought content normal.    ED Course  Procedures (including critical care time)  DIAGNOSTIC STUDIES: Oxygen Saturation is 97% on RA, adequate by my interpretation.    COORDINATION OF CARE: 8:02 PM- Pt advised of plan for treatment and pt agrees.  Labs Review Labs Reviewed - No data to display  Imaging Review Dg Shoulder Right  01/18/2014   CLINICAL DATA:  Fall.  EXAM: RIGHT SHOULDER - 2+ VIEW  COMPARISON:  None.  FINDINGS: There is no evidence of fracture or dislocation. There is no evidence of arthropathy or other focal bone abnormality. Soft tissues are unremarkable.  IMPRESSION: Negative.   Electronically Signed  By: Signa Kell M.D.   On: 01/18/2014 19:55   Dg Elbow Complete Right  01/18/2014   CLINICAL DATA:  Larey Seat.  Right elbow pain.  EXAM: RIGHT ELBOW - COMPLETE 3+ VIEW  COMPARISON:  None.  FINDINGS: The joint spaces are maintained. No acute elbow fracture or osteochondral abnormality. No joint effusion.  IMPRESSION: No acute bony findings.   Electronically Signed   By: Loralie Champagne M.D.   On: 01/18/2014 20:48   Dg Wrist Complete Right  01/18/2014   CLINICAL DATA:  Fall 1 day ago.  Pain all over.  EXAM: RIGHT WRIST - COMPLETE 3+ VIEW  COMPARISON:  None.  FINDINGS: There is no evidence of fracture or dislocation. There is no  evidence of arthropathy or other focal bone abnormality. Soft tissues are unremarkable.  IMPRESSION: Negative.   Electronically Signed   By: Rosalie Gums M.D.   On: 01/18/2014 19:54   Dg Hand Complete Right  01/18/2014   CLINICAL DATA:  Post fall 1 day ago with right hand pain  EXAM: RIGHT HAND - COMPLETE 3+ VIEW  COMPARISON:  None.  FINDINGS: No fracture or dislocation. Joint spaces are preserved. No erosions. Regional soft tissues appear normal. No radiopaque foreign body. No definite evidence of chondrocalcinosis.  IMPRESSION: No acute findings.   Electronically Signed   By: Simonne Come M.D.   On: 01/18/2014 20:41     EKG Interpretation None      MDM   Final diagnoses:  Fall, initial encounter  Right arm pain    Filed Vitals:   01/18/14 1858  BP: 143/71  Pulse: 88  Temp: 98.2 F (36.8 C)  TempSrc: Oral  Resp: 20  SpO2: 97%   I personally performed the services described in this documentation, which was scribed in my presence. The recorded information has been reviewed and is accurate.  Plain film of right hand, right elbow, right wrist, right shoulder negative for acute osseous injury. Negative focal neurological deficits. Pulses palpable and strong. Strength intact MCP, PIP, DP joints of the right hand. Mild snuffbox tenderness identified to the right wrist-patient placed in thumb spica for comfort purposes. Patient stable, afebrile. Patient is appearing. Discharged patient. Patient has pain medications at home. Referred to primary care provider and orthopedics. Discussed with patient to rest, ice, elevate. Discussed with patient to closely monitor symptoms and if symptoms are to worsen or change to report back to the ED - strict return instructions given.  Patient agreed to plan of care, understood, all questions answered.   Raymon Mutton, PA-C 01/18/14 2116

## 2014-01-18 NOTE — Discharge Instructions (Signed)
Please call your doctor for a followup appointment within 24-48 hours. When you talk to your doctor please let them know that you were seen in the emergency department and have them acquire all of your records so that they can discuss the findings with you and formulate a treatment plan to fully care for your new and ongoing problems. Please call and set-up an appointment with your primary care provider and orthopedics Please rest and stay hydrated  Please keep thumb spica brace on at all times Please continue to take medications at home at all times Please continue to monitor symptoms closely and if symptoms are to worsen or change (fever greater than 101, chills, sweating, nausea, vomiting, chest pain, shortness of breathe, difficulty breathing, weakness, numbness, tingling, worsening or changes to pain pattern, swelling, hot to the touch, redness, fall, injury, loss of sensation) please report back to the Emergency Department immediately.   Arthralgia Your caregiver has diagnosed you as suffering from an arthralgia. Arthralgia means there is pain in a joint. This can come from many reasons including:  Bruising the joint which causes soreness (inflammation) in the joint.  Wear and tear on the joints which occur as we grow older (osteoarthritis).  Overusing the joint.  Various forms of arthritis.  Infections of the joint. Regardless of the cause of pain in your joint, most of these different pains respond to anti-inflammatory drugs and rest. The exception to this is when a joint is infected, and these cases are treated with antibiotics, if it is a bacterial infection. HOME CARE INSTRUCTIONS   Rest the injured area for as long as directed by your caregiver. Then slowly start using the joint as directed by your caregiver and as the pain allows. Crutches as directed may be useful if the ankles, knees or hips are involved. If the knee was splinted or casted, continue use and care as directed. If  an stretchy or elastic wrapping bandage has been applied today, it should be removed and re-applied every 3 to 4 hours. It should not be applied tightly, but firmly enough to keep swelling down. Watch toes and feet for swelling, bluish discoloration, coldness, numbness or excessive pain. If any of these problems (symptoms) occur, remove the ace bandage and re-apply more loosely. If these symptoms persist, contact your caregiver or return to this location.  For the first 24 hours, keep the injured extremity elevated on pillows while lying down.  Apply ice for 15-20 minutes to the sore joint every couple hours while awake for the first half day. Then 03-04 times per day for the first 48 hours. Put the ice in a plastic bag and place a towel between the bag of ice and your skin.  Wear any splinting, casting, elastic bandage applications, or slings as instructed.  Only take over-the-counter or prescription medicines for pain, discomfort, or fever as directed by your caregiver. Do not use aspirin immediately after the injury unless instructed by your physician. Aspirin can cause increased bleeding and bruising of the tissues.  If you were given crutches, continue to use them as instructed and do not resume weight bearing on the sore joint until instructed. Persistent pain and inability to use the sore joint as directed for more than 2 to 3 days are warning signs indicating that you should see a caregiver for a follow-up visit as soon as possible. Initially, a hairline fracture (break in bone) may not be evident on X-rays. Persistent pain and swelling indicate that further evaluation, non-weight  bearing or use of the joint (use of crutches or slings as instructed), or further X-rays are indicated. X-rays may sometimes not show a small fracture until a week or 10 days later. Make a follow-up appointment with your own caregiver or one to whom we have referred you. A radiologist (specialist in reading X-rays) may  read your X-rays. Make sure you know how you are to obtain your X-ray results. Do not assume everything is normal if you do not hear from us. SEEK MEDICAL CARE IF: Bruising, swelling, or pain increases. SEEK IMMEDIATE MEDICAL CARE IF:   Your fingers or toes are numb or blue.  The pain is not responding to medications and continues to stay the same or get worse.  The pain in your joint becomes severe.  You develop a fever over 102 F (38.9 C).  It becomes impossible to move or use the joint. MAKE SURE YOU:   Understand these instructions.  Will watch your condition.  Will get help right away if you are not doing well or get worse. Document Released: 05/21/2005 Document Revised: 08/13/2011 Document Reviewed: 01/07/2008 [facility] Patient Information 2015 [employer] [place]. This information is not intended to replace advice given to you by your health care provider. Make sure you discuss any questions you have with your health care provider.

## 2014-01-18 NOTE — ED Notes (Signed)
Pt had fall yesterday, now c/o right arm to shoulder pain.

## 2014-01-20 NOTE — ED Provider Notes (Signed)
Medical screening examination/treatment/procedure(s) were performed by non-physician practitioner and as supervising physician I was immediately available for consultation/collaboration.   EKG Interpretation None       Harlie Ragle, MD 01/20/14 1417 

## 2014-04-19 ENCOUNTER — Emergency Department (HOSPITAL_COMMUNITY): Payer: PRIVATE HEALTH INSURANCE

## 2014-04-19 ENCOUNTER — Emergency Department (HOSPITAL_COMMUNITY)
Admission: EM | Admit: 2014-04-19 | Discharge: 2014-04-19 | Disposition: A | Discharge status: Home / Self Care | Payer: PRIVATE HEALTH INSURANCE | Attending: Emergency Medicine | Admitting: Emergency Medicine

## 2014-04-19 ENCOUNTER — Encounter (HOSPITAL_COMMUNITY): Payer: Self-pay | Admitting: *Deleted

## 2014-04-19 DIAGNOSIS — J45901 Unspecified asthma with (acute) exacerbation: Secondary | ICD-10-CM | POA: Insufficient documentation

## 2014-04-19 DIAGNOSIS — Z720 Tobacco use: Secondary | ICD-10-CM | POA: Diagnosis not present

## 2014-04-19 DIAGNOSIS — G8929 Other chronic pain: Secondary | ICD-10-CM | POA: Insufficient documentation

## 2014-04-19 DIAGNOSIS — Z79899 Other long term (current) drug therapy: Secondary | ICD-10-CM | POA: Diagnosis not present

## 2014-04-19 DIAGNOSIS — I10 Essential (primary) hypertension: Secondary | ICD-10-CM | POA: Insufficient documentation

## 2014-04-19 DIAGNOSIS — R110 Nausea: Secondary | ICD-10-CM

## 2014-04-19 DIAGNOSIS — J069 Acute upper respiratory infection, unspecified: Secondary | ICD-10-CM | POA: Diagnosis not present

## 2014-04-19 DIAGNOSIS — R05 Cough: Secondary | ICD-10-CM | POA: Diagnosis present

## 2014-04-19 DIAGNOSIS — F329 Major depressive disorder, single episode, unspecified: Secondary | ICD-10-CM | POA: Insufficient documentation

## 2014-04-19 DIAGNOSIS — R059 Cough, unspecified: Secondary | ICD-10-CM

## 2014-04-19 MED ORDER — ALBUTEROL SULFATE (2.5 MG/3ML) 0.083% IN NEBU: 5.0000 mg | INHALATION_SOLUTION | Freq: Once | RESPIRATORY_TRACT | Status: DC

## 2014-04-19 MED ORDER — ONDANSETRON HCL 4 MG PO TABS: 4.0000 mg | ORAL_TABLET | Freq: Three times a day (TID) | ORAL | Status: DC | PRN

## 2014-04-19 MED ORDER — ONDANSETRON 4 MG PO TBDP
4.0000 mg | ORAL_TABLET | Freq: Once | ORAL | Status: AC
  Administered 2014-04-19: 4 mg via ORAL
  Filled 2014-04-19: qty 1

## 2014-04-19 MED ORDER — HYDROCOD POLST-CHLORPHEN POLST 10-8 MG/5ML PO LQCR: 5.0000 mL | Freq: Two times a day (BID) | ORAL | Status: DC | PRN

## 2014-04-19 NOTE — ED Provider Notes (Signed)
CSN: [id number]     Arrival date & time 04/19/14  1853 History   First MD Initiated Contact with Patient 04/19/14 2143     Chief Complaint  Patient presents with  . Cough   Judy Nguyen is a 58 y.o. female with a hx of asthma, and HTN he presents the ED complaining of 5 days of cough, congestion, sore throat. She reports she was seen by her primary care physician 3 days ago diagnosed her with a sinus infection and she was started on amoxicillin, cough suppressant and albuterol inhaler. She reports she has not worsened since Friday but she has not improved either. She reports helps some nausea today but denies abdominal pain. Denies fevers since 3 days ago. She reports postnasal drip which is causing her to cough. Reports she's been using her albuterol inhaler for wheezing with relief. She does feel like she has a slight wheeze currently. She reports sick contacts at home with upper respiratory infections. She denies shortness of breath, vomiting, abdominal pain, rashes, chest pain, palpitations, dysuria, hematuria, or weakness.  (Consider location/radiation/quality/duration/timing/severity/associated sxs/prior Treatment) HPI  Past Medical History  Diagnosis Date  . Chronic back pain   . Hypertension   . Depression   . Asthma    Past Surgical History  Procedure Laterality Date  . Abdominal hysterectomy    . Wrist fracture surgery Right    History reviewed. No pertinent family history. History  Substance Use Topics  . Smoking status: Current Every Day Smoker -- 2.00 packs/day    Types: Cigarettes  . Smokeless tobacco: Never Used  . Alcohol Use: No   OB History    No data available     Review of Systems  Constitutional: Positive for chills. Negative for fever and appetite change.  HENT: Positive for congestion, postnasal drip, rhinorrhea, sneezing and sore throat. Negative for ear discharge, ear pain, facial swelling, mouth sores, nosebleeds and trouble swallowing.   Eyes:  Negative for pain, discharge, redness and visual disturbance.  Respiratory: Positive for cough and wheezing. Negative for choking, chest tightness, shortness of breath and stridor.   Cardiovascular: Negative for chest pain, palpitations and leg swelling.  Gastrointestinal: Positive for nausea. Negative for vomiting, abdominal pain, diarrhea, constipation and blood in stool.  Genitourinary: Negative for dysuria, urgency, hematuria, decreased urine volume and difficulty urinating.  Musculoskeletal: Negative for back pain, neck pain and neck stiffness.  Skin: Negative for pallor, rash and wound.  Neurological: Negative for weakness, light-headedness, numbness and headaches.  All other systems reviewed and are negative.     Allergies  Sulfa drugs cross reactors  Home Medications   Prior to Admission medications   Medication Sig Start Date End Date Taking? Authorizing Provider  cyclobenzaprine (FLEXERIL) 10 MG tablet Take 10 mg by mouth daily as needed for muscle spasms. For muscle spasms   Yes Historical Provider, MD  diazepam (VALIUM) 10 MG tablet Take 10 mg by mouth 2 (two) times daily.    Yes Historical Provider, MD  doxycycline (VIBRAMYCIN) 100 MG capsule Take 100 mg by mouth 2 (two) times daily. 04/16/14  Yes Historical Provider, MD  HYDROcodone-acetaminophen (NORCO) 10-325 MG per tablet Take 1 tablet by mouth 3 (three) times daily as needed for moderate pain.   Yes Historical Provider, MD  PROAIR HFA 108 (90 BASE) MCG/ACT inhaler Inhale 2 puffs into the lungs every 4 (four) hours as needed for wheezing or shortness of breath.  04/16/14  Yes Historical Provider, MD  sertraline (ZOLOFT) 100 MG  tablet Take 100 mg by mouth every morning.    Yes Historical Provider, MD  traMADol (ULTRAM-ER) 200 MG 24 hr tablet Take 200 mg by mouth daily.   Yes Historical Provider, MD  triamterene-hydrochlorothiazide (MAXZIDE-25) 37.5-25 MG per tablet Take 1 tablet by mouth daily.   Yes Historical Provider, MD   VIRTUSSIN A/C 100-10 MG/5ML syrup Take 5 mLs by mouth 4 (four) times daily - after meals and at bedtime. 04/16/14  Yes Historical Provider, MD  chlorpheniramine-HYDROcodone (TUSSIONEX PENNKINETIC ER) 10-8 MG/5ML LQCR Take 5 mLs by mouth every 12 (twelve) hours as needed for cough (Cough). 04/19/14   Einar GipWilliam Duncan [place], PA-C  ondansetron (ZOFRAN) 4 MG tablet Take 1 tablet (4 mg total) by mouth every 8 (eight) hours as needed for nausea. 04/19/14   Einar GipWilliam Duncan Adorian Gwynne, PA-C   BP 136/85 mmHg  Pulse 72  Temp(Src) 99 F (37.2 C) (Oral)  Resp 20  SpO2 97% Physical Exam  Constitutional: She appears well-developed and well-nourished. No distress.  HENT:  Head: Normocephalic and atraumatic.  Right Ear: External ear normal.  Left Ear: External ear normal.  Mouth/Throat: Oropharynx is clear and moist. No oropharyngeal exudate.  Bilateral tympanic membranes are pearly gray without erythema or loss of landmarks. No oropharyngeal exudate, postnasal drip noted.  No sinus tenderness on palpation.  Eyes: Conjunctivae are normal. Pupils are equal, round, and reactive to light. Right eye exhibits no discharge. Left eye exhibits no discharge.  Neck: Neck supple.  Cardiovascular: Normal rate, regular rhythm, normal heart sounds and intact distal pulses.  Exam reveals no gallop and no friction rub.   No murmur heard. Pulmonary/Chest: Effort normal. No respiratory distress. She has wheezes. She has no rales. She exhibits no tenderness.  Mild Wheezing noted to bilateral lung fields. No rales or rhonchi.   Abdominal: Soft. Bowel sounds are normal. She exhibits no distension and no mass. There is no tenderness. There is no rebound and no guarding.  Musculoskeletal: She exhibits no edema.  Lymphadenopathy:    She has no cervical adenopathy.  Neurological: She is alert. Coordination normal.  Skin: Skin is warm and dry. No rash noted. She is not diaphoretic. No erythema. No pallor.  Psychiatric: She has a  normal mood and affect. Her behavior is normal.  Nursing note and vitals reviewed.   ED Course  Procedures (including critical care time) Labs Review Labs Reviewed - No data to display  Imaging Review Dg Chest 2 View  04/19/2014   CLINICAL DATA:  Productive cough since this morning  EXAM: CHEST  2 VIEW  COMPARISON:  11/08/2010  FINDINGS: Cardiac shadow is stable. Mild interstitial changes are again seen without focal infiltrate or sizable effusion. No acute bony abnormality is noted.  IMPRESSION: Mild prominence of the interstitium stable from the previous exam. No acute abnormality is noted.   Electronically Signed   By: Alcide CleverMark  Lukens M.D.   On: 04/19/2014 19:52     EKG Interpretation None      Filed Vitals:   04/19/14 2200 04/19/14 2250 04/19/14 2255 04/19/14 2315  BP: 136/45 129/55 129/55 136/85  Pulse: 71 67 71 72  Temp:   98.3 F (36.8 C) 99 F (37.2 C)  TempSrc:   Oral Oral  Resp:  16 20 20   SpO2: 100% 100% 100% 97%     MDM   Meds given in ED:  Medications  ondansetron (ZOFRAN-ODT) disintegrating tablet 4 mg (4 mg Oral Given 04/19/14 2255)    Discharge Medication List as  of 04/19/2014 11:02 PM    START taking these medications   Details  chlorpheniramine-HYDROcodone (TUSSIONEX PENNKINETIC ER) 10-8 MG/5ML LQCR Take 5 mLs by mouth every 12 (twelve) hours as needed for cough (Cough)., Starting 04/19/2014, Until Discontinued, Print    ondansetron (ZOFRAN) 4 MG tablet Take 1 tablet (4 mg total) by mouth every 8 (eight) hours as needed for nausea., Starting 04/19/2014, Until Discontinued, Print        Final diagnoses:  Upper respiratory infection  Nausea   Judy Nguyen is a 58 y.o. female with a hx of asthma, and HTN he presents the ED complaining of 5 days of cough, congestion, sore throat. She also reports nausea started after taking amoxicillin. She denies abdominal pain. Her abdomen is nontender to palpation. The patient is afebrile. The patient's chest  x-ray is unremarkable. The patient has an albuterol inhaler at home and reports she has used this 3 times in the past 3 days with relief from her wheezing. Due to continued cough which is keeping her up at night I will prescribe tussionex for her cough. Her nausea is likely due to the amoxicillin. Her nausea resolved in the ED with Zofran 4 mg. Education provided on symptomatic treatment of upper respiratory infections. Advised she could continue taking amoxicillin until she follows up with her primary care provider. Advised her to continue to use her albuterol inhaler as prescribed as needed for wheezing. Advised her to follow-up with her primary care provider this week to ensure improvement of her symptoms. Advised her to return to the ED with new or worsening symptoms or new concerns. I advised her to return to the ED with any difficulty breathing. The patient verbalized understanding and agreement with plan.  The patient was discussed with and evaluated by Dr. Purvis SheffieldForrest Harrison who agrees with assessment and plan.     Lawana ChambersWilliam Duncan Jenese Mischke, PA-C 04/20/14 0040  Purvis SheffieldForrest Harrison, MD 04/20/14 1128

## 2014-04-19 NOTE — ED Notes (Signed)
Pt in c/o cough, congestion, fever, sore throat and intermittent stomach cramping, no distress noted, seen by PCP and told she had a sinus infection, no improvement with medications

## 2014-04-19 NOTE — Discharge Instructions (Signed)
Upper Respiratory Infection, Adult °An upper respiratory infection (URI) is also sometimes known as the common cold. The upper respiratory tract includes the nose, sinuses, throat, trachea, and bronchi. Bronchi are the airways leading to the lungs. Most people improve within 1 week, but symptoms can last up to 2 weeks. A residual cough may last even longer.  °CAUSES °Many different viruses can infect the tissues lining the upper respiratory tract. The tissues become irritated and inflamed and often become very moist. Mucus production is also common. A cold is contagious. You can easily spread the virus to others by oral contact. This includes kissing, sharing a glass, coughing, or sneezing. Touching your mouth or nose and then touching a surface, which is then touched by another person, can also spread the virus. °SYMPTOMS  °Symptoms typically develop 1 to 3 days after you come in contact with a cold virus. Symptoms vary from person to person. They may include: °· Runny nose. °· Sneezing. °· Nasal congestion. °· Sinus irritation. °· Sore throat. °· Loss of voice (laryngitis). °· Cough. °· Fatigue. °· Muscle aches. °· Loss of appetite. °· Headache. °· Low-grade fever. °DIAGNOSIS  °You might diagnose your own cold based on familiar symptoms, since most people get a cold 2 to 3 times a year. Your caregiver can confirm this based on your exam. Most importantly, your caregiver can check that your symptoms are not due to another disease such as strep throat, sinusitis, pneumonia, asthma, or epiglottitis. Blood tests, throat tests, and X-rays are not necessary to diagnose a common cold, but they may sometimes be helpful in excluding other more serious diseases. Your caregiver will decide if any further tests are required. °RISKS AND COMPLICATIONS  °You may be at risk for a more severe case of the common cold if you smoke cigarettes, have chronic heart disease (such as heart failure) or lung disease (such as asthma), or if  you have a weakened immune system. The very young and very old are also at risk for more serious infections. Bacterial sinusitis, middle ear infections, and bacterial pneumonia can complicate the common cold. The common cold can worsen asthma and chronic obstructive pulmonary disease (COPD). Sometimes, these complications can require emergency medical care and may be life-threatening. °PREVENTION  °The best way to protect against getting a cold is to practice good hygiene. Avoid oral or hand contact with people with cold symptoms. Wash your hands often if contact occurs. There is no clear evidence that vitamin C, vitamin E, echinacea, or exercise reduces the chance of developing a cold. However, it is always recommended to get plenty of rest and practice good nutrition. °TREATMENT  °Treatment is directed at relieving symptoms. There is no cure. Antibiotics are not effective, because the infection is caused by a virus, not by bacteria. Treatment may include: °· Increased fluid intake. Sports drinks offer valuable electrolytes, sugars, and fluids. °· Breathing heated mist or steam (vaporizer or shower). °· Eating chicken soup or other clear broths, and maintaining good nutrition. °· Getting plenty of rest. °· Using gargles or lozenges for comfort. °· Controlling fevers with ibuprofen or acetaminophen as directed by your caregiver. °· Increasing usage of your inhaler if you have asthma. °Zinc gel and zinc lozenges, taken in the first 24 hours of the common cold, can shorten the duration and lessen the severity of symptoms. Pain medicines may help with fever, muscle aches, and throat pain. A variety of non-prescription medicines are available to treat congestion and runny nose. Your caregiver   can make recommendations and may suggest nasal or lung inhalers for other symptoms.  HOME CARE INSTRUCTIONS   Only take over-the-counter or prescription medicines for pain, discomfort, or fever as directed by your  caregiver.  Use a warm mist humidifier or inhale steam from a shower to increase air moisture. This may keep secretions moist and make it easier to breathe.  Drink enough water and fluids to keep your urine clear or pale yellow.  Rest as needed.  Return to work when your temperature has returned to normal or as your caregiver advises. You may need to stay home longer to avoid infecting others. You can also use a face mask and careful hand washing to prevent spread of the virus. SEEK MEDICAL CARE IF:   After the first few days, you feel you are getting worse rather than better.  You need your caregiver's advice about medicines to control symptoms.  You develop chills, worsening shortness of breath, or brown or red sputum. These may be signs of pneumonia.  You develop yellow or brown nasal discharge or pain in the face, especially when you bend forward. These may be signs of sinusitis.  You develop a fever, swollen neck glands, pain with swallowing, or white areas in the back of your throat. These may be signs of strep throat. SEEK IMMEDIATE MEDICAL CARE IF:   You have a fever.  You develop severe or persistent headache, ear pain, sinus pain, or chest pain.  You develop wheezing, a prolonged cough, cough up blood, or have a change in your usual mucus (if you have chronic lung disease).  You develop sore muscles or a stiff neck. Document Released: 11/14/2000 Document Revised: 08/13/2011 Document Reviewed: 08/26/2013 [facility] Patient Information 2015 [place], [place]. This information is not intended to replace advice given to you by your health care provider. Make sure you discuss any questions you have with your health care provider. Nausea, Adult Nausea is the feeling that you have an upset stomach or have to vomit. Nausea by itself is not likely a serious concern, but it may be an early sign of more serious medical problems. As nausea gets worse, it can lead to vomiting. If vomiting  develops, there is the risk of dehydration.  CAUSES   Viral infections.  Food poisoning.  Medicines.  Pregnancy.  Motion sickness.  Migraine headaches.  Emotional distress.  Severe pain from any source.  Alcohol intoxication. HOME CARE INSTRUCTIONS  Get plenty of rest.  Ask your caregiver about specific rehydration instructions.  Eat small amounts of food and sip liquids more often.  Take all medicines as told by your caregiver. SEEK MEDICAL CARE IF:  You have not improved after 2 days, or you get worse.  You have a headache. SEEK IMMEDIATE MEDICAL CARE IF:   You have a fever.  You faint.  You keep vomiting or have blood in your vomit.  You are extremely weak or dehydrated.  You have dark or bloody stools.  You have severe chest or abdominal pain. MAKE SURE YOU:  Understand these instructions.  Will watch your condition.  Will get help right away if you are not doing well or get worse. Document Released: 06/28/2004 Document Revised: 02/13/2012 Document Reviewed: 01/31/2011 [facility] Patient Information 2015 [place], [place]. This information is not intended to replace advice given to you by your health care provider. Make sure you discuss any questions you have with your health care provider.

## 2015-02-11 ENCOUNTER — Emergency Department
Admission: EM | Admit: 2015-02-11 | Discharge: 2015-02-11 | Disposition: A | Discharge status: Home / Self Care | Payer: Medicare Other | Attending: Student | Admitting: Student

## 2015-02-11 ENCOUNTER — Encounter: Payer: Self-pay | Admitting: Emergency Medicine

## 2015-02-11 DIAGNOSIS — Z79899 Other long term (current) drug therapy: Secondary | ICD-10-CM | POA: Insufficient documentation

## 2015-02-11 DIAGNOSIS — G894 Chronic pain syndrome: Secondary | ICD-10-CM

## 2015-02-11 DIAGNOSIS — L508 Other urticaria: Secondary | ICD-10-CM

## 2015-02-11 DIAGNOSIS — G8929 Other chronic pain: Secondary | ICD-10-CM | POA: Diagnosis not present

## 2015-02-11 DIAGNOSIS — I10 Essential (primary) hypertension: Secondary | ICD-10-CM | POA: Insufficient documentation

## 2015-02-11 DIAGNOSIS — Z79891 Long term (current) use of opiate analgesic: Secondary | ICD-10-CM | POA: Insufficient documentation

## 2015-02-11 DIAGNOSIS — Z720 Tobacco use: Secondary | ICD-10-CM | POA: Diagnosis not present

## 2015-02-11 DIAGNOSIS — L509 Urticaria, unspecified: Secondary | ICD-10-CM | POA: Diagnosis not present

## 2015-02-11 DIAGNOSIS — R21 Rash and other nonspecific skin eruption: Secondary | ICD-10-CM | POA: Diagnosis present

## 2015-02-11 LAB — CBC WITH DIFFERENTIAL/PLATELET
BASOS ABS: 0.1 10*3/uL (ref 0–0.1)
Basophils Relative: 1 %
Eosinophils Absolute: 0.3 10*3/uL (ref 0–0.7)
Eosinophils Relative: 3 %
HCT: 44.1 % (ref 35.0–47.0)
Hemoglobin: 14.7 g/dL (ref 12.0–16.0)
Lymphocytes Relative: 29 %
Lymphs Abs: 3.1 10*3/uL (ref 1.0–3.6)
MCH: 29.3 pg (ref 26.0–34.0)
MCHC: 33.3 g/dL (ref 32.0–36.0)
MCV: 88 fL (ref 80.0–100.0)
Monocytes Absolute: 0.4 10*3/uL (ref 0.2–0.9)
Monocytes Relative: 3 %
Neutro Abs: 7 10*3/uL — ABNORMAL HIGH (ref 1.4–6.5)
Neutrophils Relative %: 64 %
Platelets: 197 10*3/uL (ref 150–440)
RBC: 5.01 MIL/uL (ref 3.80–5.20)
RDW: 14.1 % (ref 11.5–14.5)
WBC: 10.8 10*3/uL (ref 3.6–11.0)

## 2015-02-11 LAB — BASIC METABOLIC PANEL
Anion gap: 8 (ref 5–15)
BUN: 11 mg/dL (ref 6–20)
CALCIUM: 9.4 mg/dL (ref 8.9–10.3)
CO2: 32 mmol/L (ref 22–32)
CREATININE: 0.9 mg/dL (ref 0.44–1.00)
Chloride: 99 mmol/L — ABNORMAL LOW (ref 101–111)
GFR calc Af Amer: >60 mL/min (ref >60)
GLUCOSE: 134 mg/dL — AB (ref 65–99)
Potassium: 3.2 mmol/L — ABNORMAL LOW (ref 3.5–5.1)
Sodium: 139 mmol/L (ref 135–145)

## 2015-02-11 MED ORDER — HYDROCODONE-ACETAMINOPHEN 10-325 MG PO TABS: 1.0000 | ORAL_TABLET | Freq: Four times a day (QID) | ORAL | Status: DC | PRN

## 2015-02-11 MED ORDER — DIAZEPAM 10 MG PO TABS: 10.0000 mg | ORAL_TABLET | Freq: Two times a day (BID) | ORAL | Status: DC | PRN

## 2015-02-11 MED ORDER — HYDROXYZINE PAMOATE 25 MG PO CAPS: 25.0000 mg | ORAL_CAPSULE | Freq: Three times a day (TID) | ORAL | Status: DC | PRN

## 2015-02-11 NOTE — ED Provider Notes (Signed)
[facility] Emergency Department Provider Note  ____________________________________________  Time seen: Approximately 6:07 PM  I have reviewed the triage vital signs and the nursing notes.   HISTORY  Chief Complaint Rash    HPI Judy Nguyen is a 59 y.o. female who presents for evaluation of a rash on her face. Feels rundown and tired no energy. In addition she needs a refill on her current prescribed medications verified by the [place] controlled substance registry. She is out of her hydrocodone and diazepam. Her current provider was Dr. Erlinda Hong, whose license was recently surrendered.   Past Medical History  Diagnosis Date  . Chronic back pain   . Hypertension   . Depression   . Asthma     There are no active problems to display for this patient.   Past Surgical History  Procedure Laterality Date  . Abdominal hysterectomy    . Wrist fracture surgery Right     Current Outpatient Rx  Name  Route  Sig  Dispense  Refill  . cyclobenzaprine (FLEXERIL) 10 MG tablet   Oral   Take 10 mg by mouth daily as needed for muscle spasms. For muscle spasms         . diazepam (VALIUM) 10 MG tablet   Oral   Take 1 tablet (10 mg total) by mouth every 12 (twelve) hours as needed for anxiety.   30 tablet   0   . HYDROcodone-acetaminophen (NORCO) 10-325 MG per tablet   Oral   Take 1 tablet by mouth every 6 (six) hours as needed.   15 tablet   0   . hydrOXYzine (VISTARIL) 25 MG capsule   Oral   Take 1 capsule (25 mg total) by mouth 3 (three) times daily as needed.   30 capsule   0   . ondansetron (ZOFRAN) 4 MG tablet   Oral   Take 1 tablet (4 mg total) by mouth every 8 (eight) hours as needed for nausea.   12 tablet   0   . PROAIR HFA 108 (90 BASE) MCG/ACT inhaler   Inhalation   Inhale 2 puffs into the lungs every 4 (four) hours as needed for wheezing or shortness of breath.       1   . sertraline (ZOLOFT) 100 MG tablet   Oral    Take 100 mg by mouth every morning.          . traMADol (ULTRAM-ER) 200 MG 24 hr tablet   Oral   Take 200 mg by mouth daily.         Marland Kitchen triamterene-hydrochlorothiazide (MAXZIDE-25) 37.5-25 MG per tablet   Oral   Take 1 tablet by mouth daily.           Allergies Sulfa drugs cross reactors  No family history on file.  Social History Social History  Substance Use Topics  . Smoking status: Current Every Day Smoker -- 2.00 packs/day    Types: Cigarettes  . Smokeless tobacco: Never Used  . Alcohol Use: No    Review of Systems Constitutional: No fever/chills Eyes: No visual changes. ENT: No sore throat. Cardiovascular: Denies chest pain. Respiratory: Denies shortness of breath. Gastrointestinal: No abdominal pain.  No nausea, no vomiting.  No diarrhea.  No constipation. Genitourinary: Negative for dysuria. Musculoskeletal: Negative for back pain. Skin: Positive for rash along the forehead. Neurological: Negative for headaches, focal weakness or numbness.  10-point ROS otherwise negative.  ____________________________________________   PHYSICAL EXAM:  VITAL SIGNS: ED  Triage Vitals  Enc Vitals Group     BP 02/11/15 1728 147/58 mmHg     Pulse Rate 02/11/15 1728 93     Resp 02/11/15 1728 18     Temp 02/11/15 1728 97.8 F (36.6 C)     Temp Source 02/11/15 1728 Oral     SpO2 02/11/15 1728 97 %     Weight 02/11/15 1728 238 lb (107.956 kg)     Height 02/11/15 1728 5\' 4"  (1.626 m)     Head Cir --      Peak Flow --      Pain Score 02/11/15 1729 6     Pain Loc --      Pain Edu? --      Excl. in GC? --     Constitutional: Alert and oriented. Well appearing and in no acute distress. Eyes: Conjunctivae are normal. PERRL. EOMI. Head: Atraumatic. Nose: No congestion/rhinnorhea. Mouth/Throat: Mucous membranes are moist.  Oropharynx non-erythematous. Neck: No stridor.   Cardiovascular: Normal rate, regular rhythm. Grossly normal heart sounds.  Good peripheral  circulation. Respiratory: Normal respiratory effort.  No retractions. Lungs CTAB. Gastrointestinal: Soft and nontender. No distention. No abdominal bruits. No CVA tenderness. Musculoskeletal: No lower extremity tenderness nor edema.  No joint effusions. Neurologic:  Normal speech and language. No gross focal neurologic deficits are appreciated. No gait instability. Skin:  Skin is warm, dry and intact. Rash noted on the long anterior forehead. Psychiatric: Mood and affect are normal. Speech and behavior are normal.  ____________________________________________   LABS (all labs ordered are listed, but only abnormal results are displayed)  Labs Reviewed  BASIC METABOLIC PANEL - Abnormal; Notable for the following:    Potassium 3.2 (*)    Chloride 99 (*)    Glucose, Bld 134 (*)    All other components within normal limits  CBC WITH DIFFERENTIAL/PLATELET - Abnormal; Notable for the following:    Neutro Abs 7.0 (*)    All other components within normal limits   ____________________________________________    PROCEDURES  Procedure(s) performed: None  Critical Care performed: No  ____________________________________________   INITIAL IMPRESSION / ASSESSMENT AND PLAN / ED COURSE  Pertinent labs & imaging results that were available during my care of the patient were reviewed by me and considered in my medical decision making (see chart for details).  Rash of unknown etiology. Presently being followed by PCP and schedule to follow back up with her. Medication refill as a one-time courtesy.. Patient is follow-up with PCP and get referral to a new pain management. ____________________________________________   FINAL CLINICAL IMPRESSION(S) / ED DIAGNOSES  Final diagnoses:  Urticaria, acute  Chronic pain disorder      Evangeline Dakin, PA-C 02/11/15 1922  Evangeline Dakin, PA-C 02/11/15 1923  Gayla Doss, MD 02/11/15 [phone]

## 2015-02-11 NOTE — ED Notes (Addendum)
Pt states she has been treated for scabies by her PCP, states she had a rash to back of legs that was itching, states she did not finish the treatment because she was getting better with the rash and  she did not think it was scabies, states she is here to day due a rash and itching on her face, states this area on her face is the only area she has at this time with a rash, pt states she has not been able to see her regular Dr. For her pain control and is about to run out of her valium and Vicodin and she needs her medications filled

## 2015-06-28 ENCOUNTER — Emergency Department
Admission: EM | Admit: 2015-06-28 | Discharge: 2015-06-29 | Disposition: A | Discharge status: Home / Self Care | Payer: Medicare Other | Attending: Emergency Medicine | Admitting: Emergency Medicine

## 2015-06-28 ENCOUNTER — Encounter: Payer: Self-pay | Admitting: *Deleted

## 2015-06-28 DIAGNOSIS — Y9289 Other specified places as the place of occurrence of the external cause: Secondary | ICD-10-CM | POA: Diagnosis not present

## 2015-06-28 DIAGNOSIS — Y998 Other external cause status: Secondary | ICD-10-CM | POA: Diagnosis not present

## 2015-06-28 DIAGNOSIS — Y9389 Activity, other specified: Secondary | ICD-10-CM | POA: Insufficient documentation

## 2015-06-28 DIAGNOSIS — X58XXXA Exposure to other specified factors, initial encounter: Secondary | ICD-10-CM | POA: Diagnosis not present

## 2015-06-28 DIAGNOSIS — S161XXA Strain of muscle, fascia and tendon at neck level, initial encounter: Secondary | ICD-10-CM | POA: Diagnosis not present

## 2015-06-28 DIAGNOSIS — S299XXA Unspecified injury of thorax, initial encounter: Secondary | ICD-10-CM | POA: Diagnosis not present

## 2015-06-28 DIAGNOSIS — F17210 Nicotine dependence, cigarettes, uncomplicated: Secondary | ICD-10-CM | POA: Insufficient documentation

## 2015-06-28 DIAGNOSIS — Z79899 Other long term (current) drug therapy: Secondary | ICD-10-CM | POA: Diagnosis not present

## 2015-06-28 DIAGNOSIS — S199XXA Unspecified injury of neck, initial encounter: Secondary | ICD-10-CM | POA: Diagnosis present

## 2015-06-28 DIAGNOSIS — I10 Essential (primary) hypertension: Secondary | ICD-10-CM | POA: Diagnosis not present

## 2015-06-28 MED ORDER — KETOROLAC TROMETHAMINE 60 MG/2ML IM SOLN
60.0000 mg | Freq: Once | INTRAMUSCULAR | Status: AC
Start: 2015-06-28 — End: 2015-06-28
  Administered 2015-06-28: 60 mg via INTRAMUSCULAR
  Filled 2015-06-28: qty 2

## 2015-06-28 MED ORDER — ORPHENADRINE CITRATE 30 MG/ML IJ SOLN
60.0000 mg | INTRAMUSCULAR | Status: AC
  Administered 2015-06-29: 60 mg via INTRAMUSCULAR
  Filled 2015-06-28: qty 2

## 2015-06-28 MED ORDER — HYDROCODONE-ACETAMINOPHEN 10-325 MG PO TABS: 1.0000 | ORAL_TABLET | Freq: Three times a day (TID) | ORAL | Status: DC | PRN

## 2015-06-28 MED ORDER — NABUMETONE 750 MG PO TABS: 750.0000 mg | ORAL_TABLET | Freq: Two times a day (BID) | ORAL | Status: DC

## 2015-06-28 NOTE — ED Notes (Signed)
Pt c/o bilateral shoulder pain since Saturday. Pt states she was watching TV, laughed, moved neck backwards and felt a pain in both shoulders that left the pt unable to move for 20 minutes. Pt states she used cold/heat therapy along with narcotics(used daily for spinal stenosis), without relief.

## 2015-06-28 NOTE — Discharge Instructions (Signed)
Cervical Sprain A cervical sprain is when the tissues (ligaments) that hold the neck bones in place stretch or tear. HOME CARE   Put ice on the injured area.  Put ice in a plastic bag.  Place a towel between your skin and the bag.  Leave the ice on for 15-20 minutes, 3-4 times a day.  You may have been given a collar to wear. This collar keeps your neck from moving while you heal.  Do not take the collar off unless told by your doctor.  If you have long hair, keep it outside of the collar.  Ask your doctor before changing the position of your collar. You may need to change its position over time to make it more comfortable.  If you are allowed to take off the collar for cleaning or bathing, follow your doctor's instructions on how to do it safely.  Keep your collar clean by wiping it with mild soap and water. Dry it completely. If the collar has removable pads, remove them every 1-2 days to hand wash them with soap and water. Allow them to air dry. They should be dry before you wear them in the collar.  Do not drive while wearing the collar.  Only take medicine as told by your doctor.  Keep all doctor visits as told.  Keep all physical therapy visits as told.  Adjust your work station so that you have good posture while you work.  Avoid positions and activities that make your problems worse.  Warm up and stretch before being active. GET HELP IF:  Your pain is not controlled with medicine.  You cannot take less pain medicine over time as planned.  Your activity level does not improve as expected. GET HELP RIGHT AWAY IF:   You are bleeding.  Your stomach is upset.  You have an allergic reaction to your medicine.  You develop new problems that you cannot explain.  You lose feeling (become numb) or you cannot move any part of your body (paralysis).  You have tingling or weakness in any part of your body.  Your symptoms get worse. Symptoms include:  Pain,  soreness, stiffness, puffiness (swelling), or a burning feeling in your neck.  Pain when your neck is touched.  Shoulder or upper back pain.  Limited ability to move your neck.  Headache.  Dizziness.  Your hands or arms feel week, lose feeling, or tingle.  Muscle spasms.  Difficulty swallowing or chewing. MAKE SURE YOU:   Understand these instructions.  Will watch your condition.  Will get help right away if you are not doing well or get worse.   This information is not intended to replace advice given to you by your health care provider. Make sure you discuss any questions you have with your health care provider.   Document Released: 11/07/2007 Document Revised: 01/21/2013 Document Reviewed: 11/26/2012 Elsevier Interactive Patient Education 2016 [employer]  Muscle Strain A muscle strain (pulled muscle) happens when a muscle is stretched beyond normal length. It happens when a sudden, violent force stretches your muscle too far. Usually, a few of the fibers in your muscle are torn. Muscle strain is common in athletes. Recovery usually takes 1-2 weeks. Complete healing takes 5-6 weeks.  HOME CARE   Follow the PRICE method of treatment to help your injury get better. Do this the first 2-3 days after the injury:  Protect. Protect the muscle to keep it from getting injured again.  Rest. Limit your activity and  rest the injured body part.  Ice. Put ice in a plastic bag. Place a towel between your skin and the bag. Then, apply the ice and leave it on from 15-20 minutes each hour. After the third day, switch to moist heat packs.  Compression. Use a splint or elastic bandage on the injured area for comfort. Do not put it on too tightly.  Elevate. Keep the injured body part above the level of your heart.  Only take medicine as told by your doctor.  Warm up before doing exercise to prevent future muscle strains. GET HELP IF:   You have more pain or puffiness (swelling) in  the injured area.  You feel numbness, tingling, or notice a loss of strength in the injured area. MAKE SURE YOU:   Understand these instructions.  Will watch your condition.  Will get help right away if you are not doing well or get worse.   This information is not intended to replace advice given to you by your health care provider. Make sure you discuss any questions you have with your health care provider.   Document Released: 02/28/2008 Document Revised: 03/11/2013 Document Reviewed: 12/18/2012 Elsevier Interactive Patient Education 2016 [employer].   You should dose the prescription meds as directed. You MUST follow-up with your provider or specialist for ongoing management of your chronic pain.

## 2015-06-28 NOTE — ED Provider Notes (Signed)
[facility] Emergency Department Provider Note ____________________________________________  Time seen: 2259  I have reviewed the triage vital signs and the nursing notes.  HISTORY  Chief Complaint  Back Pain  HPI Judy Nguyen is a 60 y.o. female presents to the ED for evaluation and management of pain to the bilateral upper shoulders and trapezius muscles since Saturday. She describes she was watching TV in bed, when she laughed, threw her head back, and felt immediate pain in the both upper shoulders. Since that time the pain is referred into the posterior neck bilaterally. She denies any distal paresthesias, grip strength changes, or paralysis. She utilize ice and heat therapy along with her daily narcotics for her underlying history of lumbar spinal stenosis. She denies any significant relief, noting that she has since run out of her pain medicine. She is expected to follow-up with a local pain management provider on February 4. Dr. Norma Fredrickson is her primary care provider has been refilling her prescription hydrocodone 10-325 mg in the interim.She rates the pain to her upper trapezius musculature at 8/10 in triage.  Past Medical History  Diagnosis Date  . Chronic back pain   . Hypertension   . Depression   . Asthma     There are no active problems to display for this patient.   Past Surgical History  Procedure Laterality Date  . Abdominal hysterectomy    . Wrist fracture surgery Right     Current Outpatient Rx  Name  Route  Sig  Dispense  Refill  . cyclobenzaprine (FLEXERIL) 10 MG tablet   Oral   Take 10 mg by mouth daily as needed for muscle spasms. For muscle spasms         . diazepam (VALIUM) 10 MG tablet   Oral   Take 1 tablet (10 mg total) by mouth every 12 (twelve) hours as needed for anxiety.   30 tablet   0   . HYDROcodone-acetaminophen (NORCO) 10-325 MG per tablet   Oral   Take 1 tablet by mouth every 6 (six) hours as needed.   15  tablet   0   . HYDROcodone-acetaminophen (NORCO) 10-325 MG tablet   Oral   Take 1 tablet by mouth every 8 (eight) hours as needed.   15 tablet   0   . hydrOXYzine (VISTARIL) 25 MG capsule   Oral   Take 1 capsule (25 mg total) by mouth 3 (three) times daily as needed.   30 capsule   0   . nabumetone (RELAFEN) 750 MG tablet   Oral   Take 1 tablet (750 mg total) by mouth 2 (two) times daily.   30 tablet   0   . ondansetron (ZOFRAN) 4 MG tablet   Oral   Take 1 tablet (4 mg total) by mouth every 8 (eight) hours as needed for nausea.   12 tablet   0   . PROAIR HFA 108 (90 BASE) MCG/ACT inhaler   Inhalation   Inhale 2 puffs into the lungs every 4 (four) hours as needed for wheezing or shortness of breath.       1   . sertraline (ZOLOFT) 100 MG tablet   Oral   Take 100 mg by mouth every morning.          . traMADol (ULTRAM-ER) 200 MG 24 hr tablet   Oral   Take 200 mg by mouth daily.         Marland Kitchen triamterene-hydrochlorothiazide (MAXZIDE-25) 37.5-25 MG per  tablet   Oral   Take 1 tablet by mouth daily.          Allergies Sulfa drugs cross reactors  No family history on file.  Social History Social History  Substance Use Topics  . Smoking status: Current Every Day Smoker -- 2.00 packs/day    Types: Cigarettes  . Smokeless tobacco: Never Used  . Alcohol Use: No   Review of Systems  Constitutional: Negative for fever. Eyes: Negative for visual changes. ENT: Negative for sore throat. Cardiovascular: Negative for chest pain. Respiratory: Negative for shortness of breath. Gastrointestinal: Negative for abdominal pain, vomiting and diarrhea. Genitourinary: Negative for dysuria. Musculoskeletal: Positive for upper back pain. Skin: Negative for rash. Neurological: Negative for headaches, focal weakness or numbness. ____________________________________________  PHYSICAL EXAM:  VITAL SIGNS: ED Triage Vitals  Enc Vitals Group     BP 06/28/15 2119 141/67 mmHg      Pulse Rate 06/28/15 2119 82     Resp 06/28/15 2119 16     Temp 06/28/15 2119 97.7 F (36.5 C)     Temp Source 06/28/15 2119 Oral     SpO2 06/28/15 2119 95 %     Weight 06/28/15 2119 225 lb (102.059 kg)     Height 06/28/15 2119  (1.626 m)     Head Cir --      Peak Flow --      Pain Score 06/28/15 2119 7     Pain Loc --      Pain Edu? --      Excl. in GC? --    Constitutional: Alert and oriented. Well appearing and in no distress. Head: Normocephalic and atraumatic.      Eyes: Conjunctivae are normal. PERRL. Normal extraocular movements      Ears: Canals clear. TMs intact bilaterally.   Nose: No congestion/rhinorrhea.   Mouth/Throat: Mucous membranes are moist.   Neck: Supple. No thyromegaly. Normal ROM Hematological/Lymphatic/Immunological: No cervical lymphadenopathy. Cardiovascular: Normal rate, regular rhythm.  Respiratory: Normal respiratory effort. No wheezes/rales/rhonchi. Gastrointestinal: Soft and nontender. No distention. Musculoskeletal: Normal spinal alignment without midline tenderness, spasm, deformity, or step-off. Patient with minimal tenderness palpation over the upper trapezius musculature bilaterally. Normal rotator cuff testing on exam. Nontender with normal range of motion in all extremities.  Neurologic: Cranial nerves II through XII grossly intact. Normal intrinsic and opposition testing. Normal UE DTRs bilaterally.  Normal gait without ataxia. Normal speech and language. No gross focal neurologic deficits are appreciated. Skin:  Skin is warm, dry and intact. No rash noted. Psychiatric: Mood and affect are normal. Patient exhibits appropriate insight and judgment. ____________________________________________  PROCEDURES  Toradol 60 mg IM Norflex 60 mg IM ____________________________________________  INITIAL IMPRESSION / ASSESSMENT AND PLAN / ED COURSE  Patient with acute scapulothoracic muscle strain without evidence of neuromuscular  deficit. She is otherwise noted to have a normal exam today. She is treated in the ED with injection medicines for her pain and muscle spasm. She is discharged with prescriptions for Relafen and #15 hydrocodone 10-325 mg. She is advised that she must follow-up with Dr. Norma Fredrickson for any additional medication prescriptions in the interim, until she is evaluated by her new pain management specialist on February 4 as planned. ____________________________________________  FINAL CLINICAL IMPRESSION(S) / ED DIAGNOSES  Final diagnoses:  Strain of neck muscle, initial encounter      Lissa Hoard, PA-C 06/28/15 2352  Jennye Moccasin, MD 06/29/15 0002

## 2015-06-28 NOTE — ED Notes (Signed)
Pt has upper back pain going into both shoulders and neck.  Pt states she was laying in bed and began laughing and felt something pull in her back.  Pt also lifted cast iron pans and pain worsened today.  Using bengay without relief

## 2015-08-25 ENCOUNTER — Other Ambulatory Visit: Payer: Self-pay

## 2015-08-25 DIAGNOSIS — Z1231 Encounter for screening mammogram for malignant neoplasm of breast: Secondary | ICD-10-CM

## 2015-09-07 ENCOUNTER — Ambulatory Visit
Admission: RE | Admit: 2015-09-07 | Discharge: 2015-09-07 | Disposition: A | Discharge status: Home / Self Care | Payer: Medicare Other | Source: Ambulatory Visit

## 2015-09-07 DIAGNOSIS — Z1231 Encounter for screening mammogram for malignant neoplasm of breast: Secondary | ICD-10-CM

## 2015-09-12 ENCOUNTER — Other Ambulatory Visit: Payer: Self-pay | Admitting: Internal Medicine

## 2015-09-12 DIAGNOSIS — R928 Other abnormal and inconclusive findings on diagnostic imaging of breast: Secondary | ICD-10-CM

## 2015-09-19 ENCOUNTER — Other Ambulatory Visit: Payer: Medicare Other

## 2015-09-19 ENCOUNTER — Inpatient Hospital Stay: Admission: RE | Admit: 2015-09-19 | Payer: Medicare Other | Source: Ambulatory Visit

## 2015-11-16 ENCOUNTER — Ambulatory Visit
Admission: RE | Admit: 2015-11-16 | Discharge: 2015-11-16 | Disposition: A | Discharge status: Home / Self Care | Payer: Medicare Other | Source: Ambulatory Visit | Attending: Internal Medicine | Admitting: Internal Medicine

## 2015-11-16 ENCOUNTER — Other Ambulatory Visit: Payer: Self-pay | Admitting: Internal Medicine

## 2015-11-16 DIAGNOSIS — R928 Other abnormal and inconclusive findings on diagnostic imaging of breast: Secondary | ICD-10-CM

## 2015-12-13 ENCOUNTER — Ambulatory Visit (INDEPENDENT_AMBULATORY_CARE_PROVIDER_SITE_OTHER): Payer: Medicare Other | Admitting: Neurology

## 2015-12-13 ENCOUNTER — Encounter: Payer: Self-pay | Admitting: Neurology

## 2015-12-13 VITALS — BP 142/80 | HR 72 | Resp 18 | Ht 63.5 in | Wt 233.0 lb

## 2015-12-13 DIAGNOSIS — R202 Paresthesia of skin: Secondary | ICD-10-CM

## 2015-12-13 MED ORDER — GABAPENTIN 100 MG PO CAPS: ORAL_CAPSULE | ORAL | Status: DC

## 2015-12-13 NOTE — Progress Notes (Signed)
Subjective:    Patient ID: Judy Nguyen is a 60 y.o. female.  HPI     Huston Foley, MD, PhD [facility] [address] [address] [place], [place] [address]  Dear Dr. Renne Crigler,   I saw your patient, Judy Nguyen, upon your kind request in my neurologic clinic today for initial consultation of her paresthesias, particularly affecting her right upper extremity. The patient is unaccompanied today. As you know, [name] is a 60 year old right-handed woman with an underlying medical history of lumbar spinal stenosis, low back pain, allergic rhinitis, leg edema, hypertension, osteopenia, smoking, and obesity, depression, who reports tingling in the right arm for the past years, worse in the past 2 months, With burning sensation reported in her right forearm, intermittent. She has a history of right wrist Fx in 2005 and had surgery in 2006 without hardware in place. She had transient symptoms then, but improved and had extensive PT for about 6 months for this. She is on disability for the R hand.   She started having burning and tingling in the right forearm to the elbow area and the volar aspect of the hand years ago, but seems to be more frequent. She has not had EMG/NCV testing, although may have had EMG/NCV after her surgery.   I reviewed your office note from 11/17/2015, which you kindly included. Recent laboratory test results were reviewed as well which includes CBC with platelets, which was unremarkable on 11/16/2015, urinalysis was negative on 11/16/2015 with the exception of trace ketones, small blood, trace urine leukocytes.  Of note, she has taken narcotic pain medication for back pain. She was also referred to pain management earlier this year. She did not go for the appointment. She is not on narcotic pain medication.    Of note, she had a right wrist, elbow and shoulder X rays on 01/18/2014, as she fell and hit her right elbow, and I reviewed the results,  findings were negative for any acute bony findings.  Of note, she had an MRI lumbar spine on 02/03/2009, ordered by Dr. Barbaraann Barthel, and I reviewed the results:   IMPRESSION:    1.  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  Narrowing of the lateral recess of this level also again seen. 2.  No change in bilateral stenosis of the lateral recesses and foramina at L5-S1. 3.  No change in mild to moderate central canal stenosis L3-4.   She smokes about 1 ppd to 1 1/2 ppd, does not drink alcohol.   Her Past Medical History Is Significant For: Past Medical History  Diagnosis Date  . Chronic back pain   . Hypertension   . Depression   . Asthma     Her Past Surgical History Is Significant For: Past Surgical History  Procedure Laterality Date  . Abdominal hysterectomy    . Wrist fracture surgery Right     Her Family History Is Significant For: Family History  Problem Relation Age of Onset  . Diabetes Mother   . Congestive Heart Failure Mother   . Kidney cancer Father   . Diabetes Father     Her Social History Is Significant For: Social History   Social History  . Marital Status: Single    Spouse Name: N/A  . Number of Children: 1  . Years of Education: college   Occupational History  . Retired    Social History Main Topics  . Smoking status: Current  Every Day Smoker -- 1.00 packs/day    Types: Cigarettes  . Smokeless tobacco: Never Used  . Alcohol Use: No  . Drug Use: No  . Sexual Activity: No   Other Topics Concern  . None   Social History Narrative   Drinks 2 cups of coffee a day     Her Allergies Are:  Allergies  Allergen Reactions  . Sulfa Drugs Cross Reactors Swelling  :   Her Current Medications Are:  Outpatient Encounter Prescriptions as of 12/13/2015  Medication Sig  . PROAIR HFA 108 (90 BASE) MCG/ACT inhaler Inhale 2 puffs into the lungs every 4 (four) hours as needed for wheezing or shortness of breath.    . sertraline (ZOLOFT) 100 MG tablet Take 100 mg by mouth every morning.   Marland Kitchen spironolactone (ALDACTONE) 50 MG tablet TK 1 T PO QD  . [DISCONTINUED] cyclobenzaprine (FLEXERIL) 10 MG tablet Take 10 mg by mouth daily as needed for muscle spasms. For muscle spasms  . [DISCONTINUED] diazepam (VALIUM) 10 MG tablet Take 1 tablet (10 mg total) by mouth every 12 (twelve) hours as needed for anxiety.  . [DISCONTINUED] HYDROcodone-acetaminophen (NORCO) 10-325 MG per tablet Take 1 tablet by mouth every 6 (six) hours as needed.  . [DISCONTINUED] HYDROcodone-acetaminophen (NORCO) 10-325 MG tablet Take 1 tablet by mouth every 8 (eight) hours as needed.  . [DISCONTINUED] hydrOXYzine (VISTARIL) 25 MG capsule Take 1 capsule (25 mg total) by mouth 3 (three) times daily as needed.  . [DISCONTINUED] nabumetone (RELAFEN) 750 MG tablet Take 1 tablet (750 mg total) by mouth 2 (two) times daily.  . [DISCONTINUED] ondansetron (ZOFRAN) 4 MG tablet Take 1 tablet (4 mg total) by mouth every 8 (eight) hours as needed for nausea.  . [DISCONTINUED] traMADol (ULTRAM-ER) 200 MG 24 hr tablet Take 200 mg by mouth daily.  . [DISCONTINUED] triamterene-hydrochlorothiazide (MAXZIDE-25) 37.5-25 MG per tablet Take 1 tablet by mouth daily.   No facility-administered encounter medications on file as of 12/13/2015.  : Review of Systems:  Out of a complete 14 point review of systems, all are reviewed and negative with the exception of these symptoms as listed below:   Review of Systems  Neurological:       Patient states that she has numbness and tingling in R arm and hand since May 2017. Reports that it is from finger tips to elbow. When it started she was sleeping on sofa at daughter's house.     Objective:  Neurologic Exam  Physical Exam Physical Examination:   Filed Vitals:   12/13/15 1445  BP: 142/80  Pulse: 72  Resp: 18   General Examination: The patient is a very pleasant 60 y.o. female in no acute distress. She appears  well-developed and well-nourished and adequately groomed.   HEENT: Normocephalic, atraumatic, pupils are equal, round and reactive to light and accommodation. Funduscopic exam is normal with sharp disc margins noted. Extraocular tracking is good without limitation to gaze excursion or nystagmus noted. Normal smooth pursuit is noted. Hearing is grossly intact. Tympanic membranes are clear bilaterally. Face is symmetric with normal facial animation and normal facial sensation. Speech is clear with no dysarthria noted. There is no hypophonia. There is no lip, neck/head, jaw or voice tremor. Neck is supple with full range of passive and active motion. There are no carotid bruits on auscultation. Oropharynx exam reveals: moderate mouth dryness, poor dental hygiene and moderate airway crowding.   Chest: Clear to auscultation without wheezing, rhonchi or crackles noted.  Heart:  S1+S2+0, regular and normal without murmurs, rubs or gallops noted.   Abdomen: Soft, non-tender and non-distended with normal bowel sounds appreciated on auscultation.  Extremities: There is no pitting edema in the distal lower extremities bilaterally. Pedal pulses are intact.  Skin: Warm and dry without trophic changes noted.   Musculoskeletal: exam reveals no obvious joint deformities, tenderness or joint swelling or erythema.   Neurologically:  Mental status: The patient is awake, alert and oriented in all 4 spheres. Her immediate and remote memory, attention, language skills and fund of knowledge are appropriate. There is no evidence of aphasia, agnosia, apraxia or anomia. Speech is clear with normal prosody and enunciation. Thought process is linear. Mood is normal and affect is normal.  Cranial nerves II - XII are as described above under HEENT exam. In addition: shoulder shrug is normal with equal shoulder height noted. Motor exam: Normal bulk, strength and tone is noted. There is no drift, tremor or rebound. Romberg is  negative. Reflexes are 2+ throughout. Babinski: Toes are flexor bilaterally. Fine motor skills and coordination: intact with normal finger taps, normal hand movements, normal rapid alternating patting, normal foot taps and normal foot agility.  Cerebellar testing: No dysmetria or intention tremor on finger to nose testing. Heel to shin is unremarkable bilaterally. There is no truncal or gait ataxia.  Sensory exam: intact to light touch, pinprick, vibration, temperature sense in the upper and lower extremities.  Gait, station and balance: She stands easily. No veering to one side is noted. No leaning to one side is noted. Posture is age-appropriate and stance is narrow based. Gait shows normal stride length and normal pace. No problems turning are noted. Tandem walk is unremarkable.   Assessment and Plan:  In summary, Launa Grillndrea M Beck is a very pleasant 60 y.o.-year old female with an underlying medical history of lumbar spinal stenosis, low back pain, allergic rhinitis, leg edema, hypertension, osteopenia, smoking, and obesity, depression, who presents for initial consultation of her long-standing history of right forearm paresthesias, more recent history of painful tingling and burning sensation affecting her right forearm between the elbow and the volar aspect of her hands. She has on the positive side no evidence of weakness, has symmetrical reflexes and preserved fine motor skills, no atrophy. Her overall neurological exam is benign. She had recent blood work in your office as part of her physical this year, I would recommend that if you have not checked her B12 level and TSH recently within the last few months, please check it. For symptomatic treatment I suggested a trial of low-dose gabapentin starting at 100 mg strength and titration from once daily to up to 3 times a day, certainly if she can tolerate it and if it is helpful to tone down her paresthesias and burning sensation, you can certainly try to  increase that. I provided her with a prescription for Neurontin 100 mg strength with written instructions. We also talked about potential side effects and elevates and expectations with this medication.  We will go ahead and proceed with an EMG and nerve conduction test of the right upper extremity, we will call her with her test results. I will see her back routinely in a few months, sooner as needed. We will keep her posted as to her EMG and nerve conduction test results via phone call. It may be difficult to tease out the exact nature of her paresthesias. Does not appear to be radicular in nature, could be compression neuropathy or nerve damage  from prior injury. I answered all her questions today and the patient was in agreement.  Thank you very much for allowing me to participate in the care of this nice patient. If I can be of any further assistance to you please do not hesitate to call me at [phone].  Sincerely,   Huston Foley, MD, PhD

## 2015-12-13 NOTE — Patient Instructions (Addendum)
We will do an EMG and nerve conduction velocity test, which is an electrical nerve and muscle test, which we will schedule. We will call you with the results. Since Dr. Renne CriglerPharr has checked blood work recently, please make sure you had TSH and B12 checked.   For your tingling and burning sensation in your right arm, we will try Neurontin (gabapentin) 100 mg strength: 1 pill at bedtime x 1 week, then 1 pill twice daily for 1 week, then 1 pill 3 times a day thereafter. The most common side effects reported are sedation or sleepiness. Rare side effects include balance problems, confusion.

## 2015-12-19 ENCOUNTER — Telehealth: Payer: Self-pay | Admitting: Acute Care

## 2015-12-19 DIAGNOSIS — F17210 Nicotine dependence, cigarettes, uncomplicated: Secondary | ICD-10-CM

## 2015-12-21 NOTE — Telephone Encounter (Signed)
LVM for pt to return call

## 2015-12-23 NOTE — Telephone Encounter (Signed)
Called spoke with pt. Reviewed lung cancer screening program. Pt does qualify and is scheduled for a SDMV on 01/12/16. Ct order placed. Pt voiced understanding and had no further questions.

## 2015-12-23 NOTE — Telephone Encounter (Signed)
[phone] pt calling back

## 2016-01-11 ENCOUNTER — Encounter: Payer: Medicare Other | Admitting: Neurology

## 2016-01-12 ENCOUNTER — Ambulatory Visit (INDEPENDENT_AMBULATORY_CARE_PROVIDER_SITE_OTHER): Payer: Medicare Other | Admitting: Acute Care

## 2016-01-12 ENCOUNTER — Ambulatory Visit (INDEPENDENT_AMBULATORY_CARE_PROVIDER_SITE_OTHER)
Admission: RE | Admit: 2016-01-12 | Discharge: 2016-01-12 | Disposition: A | Discharge status: Home / Self Care | Payer: Medicare Other | Source: Ambulatory Visit | Attending: Acute Care | Admitting: Acute Care

## 2016-01-12 ENCOUNTER — Encounter: Payer: Self-pay | Admitting: Acute Care

## 2016-01-12 ENCOUNTER — Encounter: Payer: Self-pay | Admitting: Neurology

## 2016-01-12 DIAGNOSIS — F17210 Nicotine dependence, cigarettes, uncomplicated: Secondary | ICD-10-CM

## 2016-01-12 DIAGNOSIS — Z87891 Personal history of nicotine dependence: Secondary | ICD-10-CM

## 2016-01-12 NOTE — Progress Notes (Signed)
Shared Decision Making Visit Lung Cancer Screening Program [phone]([id number])   Eligibility:  Age 60 y.o.  Pack Years Smoking History Calculation 51 pack year smoking history (# packs/per year x # years smoked)  Recent History of coughing up blood  no  Unexplained weight loss? no ( >Than 15 pounds within the last 6 months )  Prior History Lung / other cancer no (Diagnosis within the last 5 years already requiring surveillance chest CT Scans).  Smoking Status Current Smoker  Former Smokers: Years since quit: NA  Quit Date: NA  Visit Components:  Discussion included one or more decision making aids. yes  Discussion included risk/benefits of screening. yes  Discussion included potential follow up diagnostic testing for abnormal scans. yes  Discussion included meaning and risk of over diagnosis. yes  Discussion included meaning and risk of False Positives. yes  Discussion included meaning of total radiation exposure. yes  Counseling Included:  Importance of adherence to annual lung cancer LDCT screening. yes  Impact of comorbidities on ability to participate in the program. yes  Ability and willingness to under diagnostic treatment. yes  Smoking Cessation Counseling:  Current Smokers:   Discussed importance of smoking cessation. yes  Information about tobacco cessation classes and interventions provided to patient. yes  Patient provided with "ticket" for LDCT Scan. yes  Symptomatic Patient. no  Counseling  Diagnosis Code: Tobacco Use Z72.0  Asymptomatic Patient yes  Counseling (Intermediate counseling: > three minutes counseling) [id number]  Former Smokers:   Discussed the importance of maintaining cigarette abstinence. Yes  Diagnosis Code: Personal History of Nicotine Dependence. M57.846Z87.891  Information about tobacco cessation classes and interventions provided to patient. Yes  Patient provided with "ticket" for LDCT Scan. yes  Written Order for Lung Cancer  Screening with LDCT placed in Epic. Yes (CT Chest Lung Cancer Screening Low Dose W/O CM) [id number] Z12.2-Screening of respiratory organs Z87.891-Personal history of nicotine dependence   I have spent 20 minutes of face to face time with Ms. Elita Booneash discussing the risks and benefits of lung cancer screening. We viewed a power point together that explained in detail the above noted topics. We paused at intervals to allow for questions to be asked and answered to ensure understanding.We discussed that the single most powerful action that she  can take to decrease her risk of developing lung cancer is to quit smoking. We discussed whether or not she is ready to commit to setting a quit date. She is currently not ready to set a quit date.We discussed options for tools to aid in quitting smoking including nicotine replacement therapy, non-nicotine medications, support groups, Quit Smart classes, and behavior modification. We discussed that often times setting smaller, more achievable goals, such as eliminating 1 cigarette a day for a week and then 2 cigarettes a day for a week can be helpful in slowly decreasing the number of cigarettes smoked. This allows for a sense of accomplishment as well as providing a clinical benefit. I gave her the " Be Stronger Than Your Excuses" card with contact information for community resources, classes, free nicotine replacement therapy, and access to mobile apps, text messaging, and on-line smoking cessation help. I have also given her my card and contact information in the event she needs to contact me. We discussed the time and location of the scan, and that either Jerolyn Shinammy Wilson, CMA, or I will call with the results within 24-48 hours of receiving them. I have provided her with a copy of the power point we viewed  as a resource in the event they need reinforcement of the concepts we discussed today in the office. The patient verbalized understanding of all of  the above and had no  further questions upon leaving the office. They have my contact information in the event they have any further questions.   Bevelyn Ngo, NP 01/12/2016

## 2016-01-13 ENCOUNTER — Telehealth: Payer: Self-pay | Admitting: Acute Care

## 2016-01-13 DIAGNOSIS — F17210 Nicotine dependence, cigarettes, uncomplicated: Secondary | ICD-10-CM

## 2016-01-13 NOTE — Telephone Encounter (Signed)
I have called Mrs. Judy Nguyen with the results of her low-dose CT screening. I explained to her that her scan was read as a Lung RADS 2: nodules that are benign in appearance and behavior with a very low likelihood of becoming a clinically active cancer due to size or lack of growth. Recommendation per radiology is for a repeat LDCT in 12 months. I explained that we will schedule her follow-up screening in 12 months per radiology recommendation. I did discuss the fact that the scan revealed emphysema, and aortic atherosclerosis, the degree of which cannot be gated by this exam.. I will fax a copy of the screening results to Dr. Renne CriglerPharr who ordered the scan. I'll ask him to follow-up as he feels is clinically indicated. The patient verbalized understanding of the above and had no further questions at the time the call was completed.

## 2016-03-14 ENCOUNTER — Encounter: Payer: Self-pay | Admitting: Neurology

## 2016-04-17 ENCOUNTER — Ambulatory Visit: Payer: Medicare Other | Admitting: Neurology

## 2016-12-20 ENCOUNTER — Encounter: Payer: Self-pay | Admitting: Emergency Medicine

## 2016-12-20 ENCOUNTER — Emergency Department
Admission: EM | Admit: 2016-12-20 | Discharge: 2016-12-20 | Disposition: A | Discharge status: Home / Self Care | Payer: Medicare Other | Attending: Emergency Medicine | Admitting: Emergency Medicine

## 2016-12-20 DIAGNOSIS — R21 Rash and other nonspecific skin eruption: Secondary | ICD-10-CM | POA: Insufficient documentation

## 2016-12-20 DIAGNOSIS — Z5321 Procedure and treatment not carried out due to patient leaving prior to being seen by health care provider: Secondary | ICD-10-CM | POA: Insufficient documentation

## 2016-12-20 NOTE — ED Triage Notes (Signed)
Patient reports rash on her face since Monday night that has gotten progressively worse.  Patient with no respiratory symptoms noted.

## 2017-01-14 ENCOUNTER — Ambulatory Visit (INDEPENDENT_AMBULATORY_CARE_PROVIDER_SITE_OTHER)
Admission: RE | Admit: 2017-01-14 | Discharge: 2017-01-14 | Disposition: A | Discharge status: Home / Self Care | Payer: Medicare Other | Source: Ambulatory Visit | Attending: Acute Care | Admitting: Acute Care

## 2017-01-14 ENCOUNTER — Inpatient Hospital Stay: Admission: RE | Admit: 2017-01-14 | Payer: Medicare Other | Source: Ambulatory Visit

## 2017-01-14 DIAGNOSIS — F17210 Nicotine dependence, cigarettes, uncomplicated: Secondary | ICD-10-CM

## 2017-01-14 DIAGNOSIS — Z87891 Personal history of nicotine dependence: Secondary | ICD-10-CM

## 2017-01-22 ENCOUNTER — Other Ambulatory Visit: Payer: Self-pay | Admitting: Acute Care

## 2017-01-22 DIAGNOSIS — F17210 Nicotine dependence, cigarettes, uncomplicated: Secondary | ICD-10-CM

## 2017-01-22 DIAGNOSIS — Z122 Encounter for screening for malignant neoplasm of respiratory organs: Secondary | ICD-10-CM

## 2017-02-24 ENCOUNTER — Ambulatory Visit
Admission: EM | Admit: 2017-02-24 | Discharge: 2017-02-24 | Disposition: A | Discharge status: Home / Self Care | Payer: Medicare Other | Attending: Emergency Medicine | Admitting: Emergency Medicine

## 2017-02-24 ENCOUNTER — Encounter: Payer: Self-pay | Admitting: *Deleted

## 2017-02-24 DIAGNOSIS — H60501 Unspecified acute noninfective otitis externa, right ear: Secondary | ICD-10-CM

## 2017-02-24 DIAGNOSIS — K047 Periapical abscess without sinus: Secondary | ICD-10-CM | POA: Diagnosis not present

## 2017-02-24 MED ORDER — OFLOXACIN 0.3 % OT SOLN: 5.0000 [drp] | Freq: Two times a day (BID) | OTIC | 0 refills | Status: DC

## 2017-02-24 MED ORDER — DICLOFENAC SODIUM 75 MG PO TBEC: 75.0000 mg | DELAYED_RELEASE_TABLET | Freq: Two times a day (BID) | ORAL | 0 refills | Status: DC

## 2017-02-24 MED ORDER — AMOXICILLIN-POT CLAVULANATE 875-125 MG PO TABS: 1.0000 | ORAL_TABLET | Freq: Two times a day (BID) | ORAL | 0 refills | Status: DC

## 2017-02-24 MED ORDER — HYDROCODONE-ACETAMINOPHEN 5-325 MG PO TABS: ORAL_TABLET | ORAL | 0 refills | Status: DC

## 2017-02-24 NOTE — ED Triage Notes (Signed)
Gum/mouth ulcer and dental pain x1 week.

## 2017-02-24 NOTE — ED Provider Notes (Addendum)
HPI  SUBJECTIVE:  Judy Nguyen is a 61 y.o. female who presents with 2 complaints. First, she reports 1 week of worsening throbbing, intermittent, minutes long right upper dental pain where a root canal broke off. She reports waking up with oral bleeding last night. She reports facial swelling, right sided sinus pain and pressure which is new for her. She denies fevers, drooling, trismus. No drainage from the gum. She tried salt water rinses, brushing her teeth, ibuprofen, fat back. The fat back seems to help. Symptoms are worse with talking, exposure to air, eating. It is not associated with exposure to hot and cold . Second, she reports right ear itching. States that she does a lot of swimming. She denies ear pain. No change in hearing, otorrhea. Past medical history of hypertension. No history of diabetes. She is a smoker. [id number]:[id number], Judy Beckers, MD dentist: None.   Past Medical History:  Diagnosis Date  . Asthma   . Chronic back pain   . Depression   . Hypertension     Past Surgical History:  Procedure Laterality Date  . ABDOMINAL HYSTERECTOMY    . WRIST FRACTURE SURGERY Right     Family History  Problem Relation Age of Onset  . Diabetes Mother   . Congestive Heart Failure Mother   . Kidney cancer Father   . Diabetes Father     Social History  Substance Use Topics  . Smoking status: Current Every Day Smoker    Packs/day: 1.00    Types: Cigarettes  . Smokeless tobacco: Never Used  . Alcohol use No    No current facility-administered medications for this encounter.   Current Outpatient Prescriptions:  .  sertraline (ZOLOFT) 100 MG tablet, Take 100 mg by mouth every morning. , Disp: , Rfl:  .  spironolactone (ALDACTONE) 50 MG tablet, TK 1 T PO QD, Disp: , Rfl: 4 .  amoxicillin-clavulanate (AUGMENTIN) 875-125 MG tablet, Take 1 tablet by mouth 2 (two) times daily. X 7 days, Disp: 14 tablet, Rfl: 0 .  diclofenac (VOLTAREN) 75 MG EC tablet, Take 1 tablet (75 mg total) by mouth 2  (two) times daily. Take with food, Disp: 30 tablet, Rfl: 0 .  gabapentin (NEURONTIN) 100 MG capsule, 1 pill at bedtime x 1 week, then 1 pill twice daily for 1 week, then 1 pill 3 times a day thereafter., Disp: 90 capsule, Rfl: 5 .  HYDROcodone-acetaminophen (NORCO/VICODIN) 5-325 MG tablet, 1-2 tabs q 6hr prn pain, Disp: 20 tablet, Rfl: 0 .  ofloxacin (FLOXIN) 0.3 % OTIC solution, Place 5 drops into the right ear 2 (two) times daily. X 7 days, Disp: 10 mL, Rfl: 0 .  PROAIR HFA 108 (90 BASE) MCG/ACT inhaler, Inhale 2 puffs into the lungs every 4 (four) hours as needed for wheezing or shortness of breath. , Disp: , Rfl: 1  Allergies  Allergen Reactions  . Sulfa Drugs Cross Reactors Swelling     ROS  As noted in HPI.   Physical Exam  BP (!) 149/68 (BP Location: Left Arm)   Pulse 66   Temp 97.9 F (36.6 C) (Oral)   Resp 16   SpO2 99%   Constitutional: Well developed, well nourished, no acute distress Eyes:  EOMI, conjunctiva normal bilaterally HENT: Normocephalic, atraumatic,mucus membranes moist.Very poor dentition. Tooth #5, first left upper bicuspid with a root canal post remaining. Positive tenderness. Positive gingival swelling, tenderness. No expressible purulent drainage. No other dental tenderness. No trismus. No appreciable facial swelling. No Swelling inferior to  the jaw. Positive right maxillary sinus tenderness. Right external ear canal within normal limits. Mild pain with traction on pinna and tenderness to palpation over the tragus. R external ear canal, TM within normal limits. No tenderness, crepitus over the TMJ bilaterally. Neck: Positive mild shotty tender cervical lymphadenopathy. Respiratory: Normal inspiratory effort Cardiovascular: Normal rate GI: nondistended skin: No rash, skin intact Musculoskeletal: no deformities Neurologic: Alert & oriented x 3, no focal neuro deficits Psychiatric: Speech and behavior appropriate   ED Course   Medications - No data to  display  No orders of the defined types were placed in this encounter.   No results found for this or any previous visit (from the past 24 hour(s)). No results found.  ED Clinical Impression  Dental abscess  Acute otitis externa of right ear, unspecified type   ED Assessment/Plan  [place] Narcotic database reviewed for this patient, and feel that the risk/benefit ratio today is favorable for proceeding with a prescription for controlled substance. Last area. Last opiate Prescription was in January 2017.  1. Dental abscess. Patient declined dental block. Home with Augmentin because it may have extended into her sinuses. We'll try diclofenac because the ibuprofen "messes her stomach up" combined with 1 g of Tylenol. May take an additional gram of Tylenol twice a day. Norco for severe pain. Advised patient to not take both Norco and Tylenol. Continue salt water rinses, start Listerine rinses.  Providing dental referral list. 2. Possible early otitis externa. She reports itching and does swim frequently, but she denies any pain. However she has physical exam findings Suggestive of otitis externa Therefore We'll send home with a wait-and-see prescription of Cipro eardrops.   Discussed MDM, plan and followup with patient. Discussed sn/sx that should prompt return to the ED. Patient agrees with plan.   Meds ordered this encounter  Medications  . HYDROcodone-acetaminophen (NORCO/VICODIN) 5-325 MG tablet    Sig: 1-2 tabs q 6hr prn pain    Dispense:  20 tablet    Refill:  0  . diclofenac (VOLTAREN) 75 MG EC tablet    Sig: Take 1 tablet (75 mg total) by mouth 2 (two) times daily. Take with food    Dispense:  30 tablet    Refill:  0  . amoxicillin-clavulanate (AUGMENTIN) 875-125 MG tablet    Sig: Take 1 tablet by mouth 2 (two) times daily. X 7 days    Dispense:  14 tablet    Refill:  0  . ofloxacin (FLOXIN) 0.3 % OTIC solution    Sig: Place 5 drops into the right ear 2 (two) times daily. X 7  days    Dispense:  10 mL    Refill:  0    *This clinic note was created using Scientist, clinical (histocompatibility and immunogenetics). Therefore, there may be occasional mistakes despite careful proofreading.  ?   Domenick Gong, MD 02/24/17 1107    Domenick Gong, MD 02/24/17 1128

## 2017-02-24 NOTE — Discharge Instructions (Signed)
try diclofenac combined with 1 g of Tylenol twice a day. May take an additional gram of Tylenol twice a day. Norco for severe pain. As we discussed do not take both Norco and Tylenol. Continue salt water rinses, start Listerine rinse  Wait-and-see prescription for the eardrops. May fill this if your ear starts to hurt. Otherwise you can try a half-and-half mixture of white vinegar and water in your ear after you go swimming to help prevent future external ear infections.

## 2017-02-27 ENCOUNTER — Ambulatory Visit
Admission: EM | Admit: 2017-02-27 | Discharge: 2017-02-27 | Disposition: A | Discharge status: Home / Self Care | Payer: Medicare Other | Attending: Family Medicine | Admitting: Family Medicine

## 2017-02-27 ENCOUNTER — Encounter: Payer: Self-pay | Admitting: *Deleted

## 2017-02-27 DIAGNOSIS — K0889 Other specified disorders of teeth and supporting structures: Secondary | ICD-10-CM

## 2017-02-27 MED ORDER — LIDOCAINE VISCOUS 2 % MT SOLN: 10.0000 mL | Freq: Three times a day (TID) | OROMUCOSAL | 0 refills | Status: DC | PRN

## 2017-02-27 MED ORDER — LIDOCAINE VISCOUS 2 % MT SOLN
10.0000 mL | Freq: Once | OROMUCOSAL | Status: AC
  Administered 2017-02-27: 10 mL via OROMUCOSAL

## 2017-02-27 MED ORDER — KETOROLAC TROMETHAMINE 60 MG/2ML IM SOLN
30.0000 mg | Freq: Once | INTRAMUSCULAR | Status: AC
  Administered 2017-02-27: 30 mg via INTRAMUSCULAR

## 2017-02-27 NOTE — Discharge Instructions (Signed)
Take medication as prescribed. Eat soft foot diet. Drink plenty of fluids.   Follow up with Dentist as soon as possible.   Follow up with your primary care physician this week as needed. Return to Urgent care for new or worsening concerns.

## 2017-02-27 NOTE — ED Provider Notes (Signed)
[facility] ____________________________________________  Time seen: Approximately 2:10 PM  I have reviewed the triage vital signs and the nursing notes.   HISTORY  Chief Complaint Dental Pain  HPI Judy Nguyen is a 61 y.o. female resume his pulse at bedside for evaluation of left lower frontal dental pain since last night. Reports was seen in urgent care 3 days ago for similar complaint. States at that time she was having dental pain to right upper tooth as well as nasal congestion and ear complaints, states that currently feeling the symptoms as much. Patient states that last night left lower dental pain randomly started, states was not eating at the time. Denies trauma, injury or known fracture. Denies pain radiation, denies fevers. Reports overall continued to eat and drink well. Reports is given oral Augmentin, ear drops and Norco at last urgent care visit. States she is taking these medications as prescribed. States pain pills earlier today and states that "do not touch the pain "prompting her to come in today. Has not yet called the Dentist. Reports otherwise well. Denies cardiac history. Denies renal insufficiency.  Merri Brunette, MD: PCP   Past Medical History:  Diagnosis Date  . Asthma   . Chronic back pain   . Depression   . Hypertension     There are no active problems to display for this patient.   Past Surgical History:  Procedure Laterality Date  . ABDOMINAL HYSTERECTOMY    . WRIST FRACTURE SURGERY Right     Current Outpatient Rx  . Order #: [id number] Class: Normal  . Order #: [id number] Class: Normal  . Order #: [id number] Class: Normal  . Order #: [id number] Class: Print  . Order #: [id number] Class: Normal  . Order #: [id number] Class: Normal  . Order #: [id number] Class: Historical Med  . Order #: [id number] Class: Historical Med  . Order #: [id number] Class: Historical Med    Allergies Sulfa drugs cross reactors  Family History  Problem Relation  Age of Onset  . Diabetes Mother   . Congestive Heart Failure Mother   . Kidney cancer Father   . Diabetes Father     Social History Social History  Substance Use Topics  . Smoking status: Current Every Day Smoker    Packs/day: 1.00    Types: Cigarettes  . Smokeless tobacco: Never Used  . Alcohol use No    Review of Systems Constitutional: No fever/chills. Reports continues to eat and drink foods and fluids well.  ENT: No sore throat. As above.  Cardiovascular: Denies chest pain. Respiratory: Denies shortness of breath. Gastrointestinal: No abdominal pain.   Musculoskeletal: Negative for back pain. Skin: Negative for rash.   ____________________________________________   PHYSICAL EXAM:  VITAL SIGNS: ED Triage Vitals  Enc Vitals Group     BP 02/27/17 1327 134/66     Pulse Rate 02/27/17 1327 64     Resp 02/27/17 1327 16     Temp 02/27/17 1327 98.6 F (37 C)     Temp Source 02/27/17 1327 Oral     SpO2 02/27/17 1327 96 %     Weight --      Height --      Head Circumference --      Peak Flow --      Pain Score 02/27/17 1329 10     Pain Loc --      Pain Edu? --      Excl. in GC? --     Constitutional: Alert and oriented. Well appearing and in no acute distress.  Eyes: Conjunctivae are normal.  Head: Atraumatic.No facial swelling or erythema noted. Ears: Bilateral ears no erythema, normal TMs.  Nose: No congestion/rhinnorhea. Mouth/Throat: Mucous membranes are moist.  Oropharynx non-erythematous. No tonsillar swelling or exudate. Periodontal Exam   widespread dental decay with multiple fractures noted. Tenderness to tooth 24 and surrounding tenderness. Gum line recession noted. No visualized or palpated dental abscess. No trismus.  Neck: No stridor.  Hematological/Lymphatic/Immunilogical: No cervical lymphadenopathy. Cardiovascular:   Normal rate, regular rhythm. Grossly normal heart sounds. Good peripheral circulation. Respiratory: Normal respiratory effort.   No retractions. Musculoskeletal: Steady gait. Neurologic:  Normal speech and language. No gross focal neurologic deficits are appreciated. Speech is normal. No gait instability. Skin:  Skin is warm, dry and intact. No rash noted. Psychiatric: Mood and affect are normal. Speech and behavior are normal.  ____________________________________________   LABS (all labs ordered are listed, but only abnormal results are displayed)  Labs Reviewed - No data to display ____________________________________________   PROCEDURES   INITIAL IMPRESSION / ASSESSMENT AND PLAN / ED COURSE  Pertinent labs & imaging results that were available during my care of the patient were reviewed by me and considered in my medical decision making (see chart for details).  Well-appearing patient. No acute distress. Widespread dental decay. No visualized palpated abscess. Directed for patient to continue home current antibiotic and pain medication as needed. 30 milligram IM Toradol given once in urgent care as well as Viscous Lidocaine. Patient reports viscous lidocaine to help improve. Rx given for viscous lidocaine. Encouraged patient to follow-up with dentist as soon as possible. Discussed soft food diet.Discussed indication, risks and benefits of medications with patient.  Patient was advised to see the dentist. Also advised to take the antibiotic until finished. Instructed to return to the Urgent Care or ER  for symptoms that change or worsen or if unable to schedule an appointment.  [place] controlled substance database reviewed, most recent prescription was quantity 20 Vicodin 3 days ago, prior to that approximately 6 months ago. ____________________________________________   FINAL CLINICAL IMPRESSION(S) / ED DIAGNOSES  Final diagnoses:  Pain, dental         Renford Dills, NP 02/27/17 1527

## 2017-02-27 NOTE — ED Triage Notes (Signed)
Left sided dental/gum pain, onset last night.

## 2018-03-06 ENCOUNTER — Encounter: Payer: Self-pay | Admitting: Emergency Medicine

## 2018-03-06 ENCOUNTER — Other Ambulatory Visit: Payer: Self-pay | Admitting: Family Medicine

## 2018-03-06 ENCOUNTER — Other Ambulatory Visit: Payer: Self-pay

## 2018-03-06 ENCOUNTER — Ambulatory Visit
Admission: EM | Admit: 2018-03-06 | Discharge: 2018-03-06 | Disposition: A | Discharge status: Home / Self Care | Payer: Medicare Other | Attending: Family Medicine | Admitting: Family Medicine

## 2018-03-06 DIAGNOSIS — J441 Chronic obstructive pulmonary disease with (acute) exacerbation: Secondary | ICD-10-CM

## 2018-03-06 DIAGNOSIS — F17210 Nicotine dependence, cigarettes, uncomplicated: Secondary | ICD-10-CM | POA: Diagnosis not present

## 2018-03-06 DIAGNOSIS — Z209 Contact with and (suspected) exposure to unspecified communicable disease: Secondary | ICD-10-CM | POA: Diagnosis not present

## 2018-03-06 MED ORDER — DOXYCYCLINE HYCLATE 100 MG PO CAPS: 100.0000 mg | ORAL_CAPSULE | Freq: Two times a day (BID) | ORAL | 0 refills | Status: DC

## 2018-03-06 MED ORDER — PREDNISONE 50 MG PO TABS: ORAL_TABLET | ORAL | 0 refills | Status: DC

## 2018-03-06 MED ORDER — ALBUTEROL SULFATE HFA 108 (90 BASE) MCG/ACT IN AERS: 2.0000 | INHALATION_SPRAY | Freq: Four times a day (QID) | RESPIRATORY_TRACT | 1 refills | Status: AC | PRN

## 2018-03-06 MED ORDER — HYDROCOD POLST-CPM POLST ER 10-8 MG/5ML PO SUER: 5.0000 mL | Freq: Every evening | ORAL | 0 refills | Status: DC | PRN

## 2018-03-06 NOTE — Discharge Instructions (Signed)
Medications as prescribed: Take the antibiotic and prednisone. Albuterol as needed. Cough medication at night.  Take care  Dr. Adriana Simas

## 2018-03-06 NOTE — ED Triage Notes (Signed)
Pt c/o cough, post nasal drainage, sinus congestion, and headache. Started 4 days ago. No fever, chills.

## 2018-03-06 NOTE — ED Provider Notes (Signed)
[facility]    CSN: [id number] Arrival date & time: 03/06/18  0931  History   Chief Complaint Chief Complaint  Patient presents with  . URI   HPI  62 year old female presents with respiratory symptoms.  Patient has been sick for the past 4 days.  She was exposed to a young child who was sick.  She reports severe cough which is productive.  She reports discolored sputum.  Lots of sputum.  She reports congestion and associated headache.  No fever.  No chills.  There are no medical encounters that endorse a diagnosis of COPD, however she has had CT lung cancer screening which showed emphysema.  She continues to smoke.  Cough worse at night.  No relieving factors.  No other associated symptoms.  No other complaints.  PMH, Surgical Hx, Family Hx, Social History reviewed and updated as below.  Past Medical History:  Diagnosis Date  . Asthma   . Chronic back pain   . Depression   . Hypertension    Past Surgical History:  Procedure Laterality Date  . ABDOMINAL HYSTERECTOMY    . WRIST FRACTURE SURGERY Right    OB History   None    Home Medications    Prior to Admission medications   Medication Sig Start Date End Date Taking? Authorizing Provider  sertraline (ZOLOFT) 100 MG tablet Take 100 mg by mouth every morning.    Yes [provider]  spironolactone (ALDACTONE) 50 MG tablet TK 1 T PO QD 11/29/15  Yes [provider]  albuterol (PROAIR HFA) 108 (90 Base) MCG/ACT inhaler Inhale 2 puffs into the lungs every 6 (six) hours as needed for wheezing or shortness of breath. 03/06/18   Tommie Sams, DO  chlorpheniramine-HYDROcodone (TUSSIONEX PENNKINETIC ER) 10-8 MG/5ML SUER Take 5 mLs by mouth at bedtime as needed. 03/06/18   Tommie Sams, DO  doxycycline (VIBRAMYCIN) 100 MG capsule Take 1 capsule (100 mg total) by mouth 2 (two) times daily. 03/06/18   Tommie Sams, DO  predniSONE (DELTASONE) 50 MG tablet 1 tablet daily x 5 days 03/06/18   Tommie Sams, DO      Family History Family History  Problem Relation Age of Onset  . Diabetes Mother   . Congestive Heart Failure Mother   . Kidney cancer Father   . Diabetes Father     Social History Social History   Tobacco Use  . Smoking status: Current Every Day Smoker    Packs/day: 1.50    Types: Cigarettes  . Smokeless tobacco: Never Used  Substance Use Topics  . Alcohol use: No    Alcohol/week: 0.0 standard drinks  . Drug use: No     Allergies   Sulfa drugs cross reactors   Review of Systems Review of Systems  Constitutional: Negative for fever.  HENT: Positive for congestion.   Respiratory: Positive for cough.    Physical Exam Triage Vital Signs ED Triage Vitals  Enc Vitals Group     BP 03/06/18 0943 136/85     Pulse Rate 03/06/18 0943 (!) 103     Resp 03/06/18 0943 18     Temp 03/06/18 0943 98.2 F (36.8 C)     Temp Source 03/06/18 0943 Oral     SpO2 03/06/18 0943 100 %     Weight 03/06/18 0942 244 lb (110.7 kg)     Height 03/06/18 0942 5\' 4"  (1.626 m)     Head Circumference --      Peak  Flow --      Pain Score 03/06/18 0940 0     Pain Loc --      Pain Edu? --      Excl. in GC? --    Updated Vital Signs BP 136/85 (BP Location: Left Arm)   Pulse (!) 103   Temp 98.2 F (36.8 C) (Oral)   Resp 18   Ht 5\' 4"  (1.626 m)   Wt 110.7 kg   SpO2 100%   BMI 41.88 kg/m   Visual Acuity Right Eye Distance:   Left Eye Distance:   Bilateral Distance:    Right Eye Near:   Left Eye Near:    Bilateral Near:     Physical Exam  Constitutional: She is oriented to person, place, and time. She appears well-developed. No distress.  HENT:  Head: Normocephalic and atraumatic.  Mouth/Throat: Oropharynx is clear and moist.  Poor dentition.  Cardiovascular: Normal rate and regular rhythm.  Pulmonary/Chest: Effort normal.  Wheezing noted throughout.  Neurological: She is alert and oriented to person, place, and time.  Psychiatric: She has a normal mood and affect. Her  behavior is normal.  Nursing note and vitals reviewed.  UC Treatments / Results  Labs (all labs ordered are listed, but only abnormal results are displayed) Labs Reviewed - No data to display  EKG None  Radiology No results found.  Procedures Procedures (including critical care time)  Medications Ordered in UC Medications - No data to display  Initial Impression / Assessment and Plan / UC Course  I have reviewed the triage vital signs and the nursing notes.  Pertinent labs & imaging results that were available during my care of the patient were reviewed by me and considered in my medical decision making (see chart for details).    62 year old female presents with a history and exam consistent with a COPD exacerbation.  This is a new diagnosis (no one has discussed CT findings consistent with emphysema).  Treating with albuterol, prednisone, doxycycline.  Tussionex for cough.  Final Clinical Impressions(s) / UC Diagnoses   Final diagnoses:  COPD exacerbation (HCC)     Discharge Instructions     Medications as prescribed: Take the antibiotic and prednisone. Albuterol as needed. Cough medication at night.  Take care  Dr. Adriana Simas    ED Prescriptions    Medication Sig Dispense Auth. Provider   doxycycline (VIBRAMYCIN) 100 MG capsule Take 1 capsule (100 mg total) by mouth 2 (two) times daily. 14 capsule Fleet Higham G, DO   albuterol (PROAIR HFA) 108 (90 Base) MCG/ACT inhaler Inhale 2 puffs into the lungs every 6 (six) hours as needed for wheezing or shortness of breath. 18 g Koua Deeg G, DO   predniSONE (DELTASONE) 50 MG tablet 1 tablet daily x 5 days 5 tablet Spike Desilets G, DO   chlorpheniramine-HYDROcodone (TUSSIONEX PENNKINETIC ER) 10-8 MG/5ML SUER Take 5 mLs by mouth at bedtime as needed. 60 mL Tommie Sams, DO     Controlled Substance Prescriptions [place] Controlled Substance Registry consulted? Not Applicable   Tommie Sams, DO 03/06/18 1001

## 2018-07-05 ENCOUNTER — Encounter: Payer: Self-pay | Admitting: Emergency Medicine

## 2018-07-05 ENCOUNTER — Ambulatory Visit
Admission: EM | Admit: 2018-07-05 | Discharge: 2018-07-05 | Disposition: A | Discharge status: Home / Self Care | Payer: Medicare Other | Attending: Emergency Medicine | Admitting: Emergency Medicine

## 2018-07-05 ENCOUNTER — Other Ambulatory Visit: Payer: Self-pay

## 2018-07-05 DIAGNOSIS — R0981 Nasal congestion: Secondary | ICD-10-CM

## 2018-07-05 DIAGNOSIS — R062 Wheezing: Secondary | ICD-10-CM

## 2018-07-05 DIAGNOSIS — J0140 Acute pansinusitis, unspecified: Secondary | ICD-10-CM

## 2018-07-05 DIAGNOSIS — R05 Cough: Secondary | ICD-10-CM

## 2018-07-05 DIAGNOSIS — F17210 Nicotine dependence, cigarettes, uncomplicated: Secondary | ICD-10-CM

## 2018-07-05 DIAGNOSIS — R0982 Postnasal drip: Secondary | ICD-10-CM | POA: Diagnosis not present

## 2018-07-05 DIAGNOSIS — J441 Chronic obstructive pulmonary disease with (acute) exacerbation: Secondary | ICD-10-CM | POA: Diagnosis not present

## 2018-07-05 LAB — RAPID INFLUENZA A&B ANTIGENS: Influenza B (ARMC): NEGATIVE

## 2018-07-05 LAB — RAPID INFLUENZA A&B ANTIGENS (ARMC ONLY): INFLUENZA A (ARMC): NEGATIVE

## 2018-07-05 MED ORDER — DOXYCYCLINE HYCLATE 100 MG PO CAPS: 100.0000 mg | ORAL_CAPSULE | Freq: Two times a day (BID) | ORAL | 0 refills | Status: AC

## 2018-07-05 MED ORDER — FLUTICASONE PROPIONATE 50 MCG/ACT NA SUSP: 2.0000 | Freq: Every day | NASAL | 0 refills | Status: DC

## 2018-07-05 MED ORDER — PREDNISONE 50 MG PO TABS: 50.0000 mg | ORAL_TABLET | Freq: Every day | ORAL | 0 refills | Status: AC

## 2018-07-05 MED ORDER — AEROCHAMBER PLUS MISC: 2 refills | Status: AC

## 2018-07-05 NOTE — ED Provider Notes (Signed)
HPI  SUBJECTIVE:  Judy Nguyen is a 63 y.o. female who presents with 5 days of URI symptoms with nasal congestion, greenish rhinorrhea, feeling feverish.  Has not checked her temperature at home.  States she was getting better, but then got acutely worse last night and had some epistaxis.  She reports sinus pain and pressure starting this morning.  She has a sinus headache present with coughing, body aches, cough productive of mucus, wheezing at night.  No chest pain, shortness of breath, dyspnea on exertion.  No antibiotics in the past month.  No antipyretic in the past 4 to 6 hours.  She tried Benadryl, Alka-Seltzer cold plus and Mucinex.  The Alka-Seltzer cold plus and Mucinex helped.  No aggravating factors.  She has a past medical history of COPD, is a smoker for the past 44 years, has cut back to 1 pack/day.  She also has a history of hypertension, chronic pain.  No history of diabetes.  PMD: Merri Brunette, MD    Past Medical History:  Diagnosis Date  . Asthma   . Chronic back pain   . Depression   . Hypertension     Past Surgical History:  Procedure Laterality Date  . ABDOMINAL HYSTERECTOMY    . WRIST FRACTURE SURGERY Right     Family History  Problem Relation Age of Onset  . Diabetes Mother   . Congestive Heart Failure Mother   . Kidney cancer Father   . Diabetes Father     Social History   Tobacco Use  . Smoking status: Current Every Day Smoker    Packs/day: 1.50    Types: Cigarettes  . Smokeless tobacco: Never Used  Substance Use Topics  . Alcohol use: No    Alcohol/week: 0.0 standard drinks  . Drug use: No    No current facility-administered medications for this encounter.   Current Outpatient Medications:  .  albuterol (PROAIR HFA) 108 (90 Base) MCG/ACT inhaler, Inhale 2 puffs into the lungs every 6 (six) hours as needed for wheezing or shortness of breath., Disp: 18 g, Rfl: 1 .  spironolactone (ALDACTONE) 50 MG tablet, TK 1 T PO QD, Disp: , Rfl: 4 .   doxycycline (VIBRAMYCIN) 100 MG capsule, Take 1 capsule (100 mg total) by mouth 2 (two) times daily for 7 days., Disp: 14 capsule, Rfl: 0 .  fluticasone (FLONASE) 50 MCG/ACT nasal spray, Place 2 sprays into both nostrils daily., Disp: 16 g, Rfl: 0 .  predniSONE (DELTASONE) 50 MG tablet, Take 1 tablet (50 mg total) by mouth daily with breakfast for 5 days., Disp: 5 tablet, Rfl: 0 .  Spacer/Aero-Holding Chambers (AEROCHAMBER PLUS) inhaler, Use as instructed, Disp: 1 each, Rfl: 2  Allergies  Allergen Reactions  . Sulfa Drugs Cross Reactors Swelling     ROS  As noted in HPI.   Physical Exam  BP 121/62 (BP Location: Left Arm)   Pulse 78   Temp 98.6 F (37 C) (Oral)   Resp 16   Ht 5\' 4"  (1.626 m)   Wt 104.3 kg   SpO2 96%   BMI 39.48 kg/m   Constitutional: Well developed, well nourished, no acute distress Eyes:  EOMI, conjunctiva normal bilaterally HENT: Normocephalic, atraumatic,mucus membranes moist.  Friable nasal mucosa.  Swollen turbinates.  Positive maxillary and frontal sinus tenderness.  No obvious postnasal drip. Respiratory: Normal inspiratory effort.  Diffuse wheezing throughout.  No rales or rhonchi Cardiovascular: Normal rate rhythm, no murmurs, rubs, gallops GI: nondistended skin: No rash, skin  intact Musculoskeletal: no deformities Neurologic: Alert & oriented x 3, no focal neuro deficits Psychiatric: Speech and behavior appropriate   ED Course   Medications - No data to display  Orders Placed This Encounter  Procedures  . Rapid Influenza A&B Antigens ([facility] only)    Standing Status:   Standing    Number of Occurrences:   1  . Droplet precaution    Standing Status:   Standing    Number of Occurrences:   1    Results for orders placed or performed during the hospital encounter of 07/05/18 (from the past 24 hour(s))  Rapid Influenza A&B Antigens ([facility] only)     Status: None   Collection Time: 07/05/18  8:46 AM  Result Value Ref Range   Influenza A  ([facility]) NEGATIVE NEGATIVE   Influenza B ([facility]) NEGATIVE NEGATIVE   No results found.  ED Clinical Impression  Acute non-recurrent pansinusitis  COPD exacerbation [facility](HCC)   ED Assessment/Plan  flu negative.  Suspect initial URI, now with a sinusitis and COPD exacerbation.  Discontinue Benadryl, continue Mucinex or Mucinex DM.  Start Flonase, saline nasal irrigation with a Lloyd HugerNeil med rinse and distilled water as often as she wants, 2 puffs from her albuterol inhaler using a spacer every 4 hours for the next 2 days, then every 6 hours for 2 days, then as needed.  Doxycycline, 50 mg of prednisone for 5 days.  Follow-up with PMD as needed, to the ER if she gets worse.  Discussed labs, MDM, treatment plan, and plan for follow-up with patient. Discussed sn/sx that should prompt return to the ED. patient agrees with plan.   Meds ordered this encounter  Medications  . doxycycline (VIBRAMYCIN) 100 MG capsule    Sig: Take 1 capsule (100 mg total) by mouth 2 (two) times daily for 7 days.    Dispense:  14 capsule    Refill:  0  . fluticasone (FLONASE) 50 MCG/ACT nasal spray    Sig: Place 2 sprays into both nostrils daily.    Dispense:  16 g    Refill:  0  . Spacer/Aero-Holding Chambers (AEROCHAMBER PLUS) inhaler    Sig: Use as instructed    Dispense:  1 each    Refill:  2  . predniSONE (DELTASONE) 50 MG tablet    Sig: Take 1 tablet (50 mg total) by mouth daily with breakfast for 5 days.    Dispense:  5 tablet    Refill:  0    *This clinic note was created using Scientist, clinical (histocompatibility and immunogenetics)Dragon dictation software. Therefore, there may be occasional mistakes despite careful proofreading.   ?    Domenick GongMortenson, Tamyra Fojtik, MD 07/05/18 1758

## 2018-07-05 NOTE — ED Triage Notes (Signed)
Patient c/o cough, runny nose, chills, fever,and bodyaches that started on Tuesday.

## 2018-07-05 NOTE — Discharge Instructions (Addendum)
Discontinue Benadryl, continue Mucinex or Mucinex DM.  Start Flonase, saline nasal irrigation with a Lloyd Huger med rinse and distilled water as often as you want, 2 puffs from her albuterol inhaler using a spacer every 4 hours for the next 2 days, then every 6 hours for 2 days, then as needed.  Finish the doxycycline which is an antibiotic, 50 mg of prednisone for 5 days.

## 2019-01-12 ENCOUNTER — Ambulatory Visit (INDEPENDENT_AMBULATORY_CARE_PROVIDER_SITE_OTHER): Payer: Medicare Other | Admitting: Pulmonary Disease

## 2019-01-12 ENCOUNTER — Other Ambulatory Visit: Payer: Self-pay

## 2019-01-12 ENCOUNTER — Encounter: Payer: Self-pay | Admitting: Pulmonary Disease

## 2019-01-12 VITALS — BP 128/88 | HR 79 | Temp 97.6°F | Ht 64.5 in | Wt 238.0 lb

## 2019-01-12 DIAGNOSIS — R911 Solitary pulmonary nodule: Secondary | ICD-10-CM

## 2019-01-12 DIAGNOSIS — J432 Centrilobular emphysema: Secondary | ICD-10-CM

## 2019-01-12 DIAGNOSIS — F17200 Nicotine dependence, unspecified, uncomplicated: Secondary | ICD-10-CM

## 2019-01-12 MED ORDER — VARENICLINE TARTRATE 0.5 MG X 11 & 1 MG X 42 PO MISC: ORAL | 0 refills | Status: DC

## 2019-01-12 NOTE — Patient Instructions (Addendum)
Thank you for visiting Dr. Valeta Harms at [facility]. Today we recommend the following:  Orders Placed This Encounter  Procedures  . Ambulatory Referral for Lung Cancer Scre  . Pulmonary Function Test   Meds ordered this encounter  Medications  . varenicline (CHANTIX PAK) 0.5 MG X 11 & 1 MG X 42 tablet    Sig: Take one 0.5 mg tablet by mouth once daily for 3 days, then increase to one 0.5 mg tablet twice daily for 4 days, then increase to one 1 mg tablet twice daily.    Dispense:  53 tablet    Refill:  0   Return in about 2 weeks (around 01/26/2019). with PFTS (or first available scheduled) to see Eric Form, NP.   Needs a follow up lung cancer screening shared decision appointment for repeat scans to reestablish  Can see me in 3 months for smoking cessation follow up.   You must quit smoking or vaping. This is the single most important thing that you can do to improve your lung health.   S = Set a quit date. T = Tell family, friends, and the people around you that you plan to quit. A = Anticipate or plan ahead for the tough times you'll face while quitting. R = Remove cigarettes and other tobacco products from your home, car, and work T = Talk to [place] about getting help to quit  If you need help feel free to reach out to our office, [place] [facility]: [phone], call 1-800-QUIT-NOW, or visit [email or web address]    Please do your part to reduce the spread of COVID-19.

## 2019-01-12 NOTE — Progress Notes (Signed)
Synopsis: Referred in August 2020 for emphysema by Merri BrunettePharr, Walter, MD  Subjective:   PATIENT ID: Launa GrillAndrea M Ferrie GENDER: female DOB: 09/25/1955, MRN: [id number]  Chief Complaint  Patient presents with  . Consult    She reports she is apart of the lung cancer screening program. She smokes currently. She reports she does not have any SOB that she is concerned about. She reports some wheezing.     Patient was seen in our office last for lung cancer screening program in August 2017.,  Current smoker greater than 50-pack-year history.  No prior pulmonary function tests on file.  Past medical history of asthma chronic brain depression, hypertension.  Patient was seen in med been urgent care for COPD exacerbation.  She was treated with albuterol, prednisone and doxycycline.  Again seen in February 2020 with URI symptoms and concern for COPD exacerbation.  Again treated with doxycycline prednisone and albuterol.  Last set of chest imaging was lung cancer screening in 2018.  Was considered lung RADS 2 benign behavior with plans for 1511-month follow-up.  She has not had this completed.  OV 01/12/2019: long time smoker she has smoked since age 63 at this point smoking 1.5 to 2 packs/day.  She is recently seen in the walk-in clinic back in the springtime for COPD exacerbation.  She was very sick at this time with flulike symptoms.  Flu negative.  She had recently traveled to [place] prior to that.  And wonders if she could have been exposed to coronavirus at the time.  She recovered after approximately a week of symptoms.  She did recently travel to [place] via car with her husband for a doctor's appointment and to visit her daughter.  She currently uses albuterol as needed for shortness of breath and wheezing.  No prior PFTs.  No formal diagnosis of COPD.  She forgot to follow-up on her lung cancer screening last year because she was out of town helping her daughter going through a divorce.  Otherwise  respiratory symptoms are stable.    Past Medical History:  Diagnosis Date  . Asthma   . Chronic back pain   . Depression   . Hypertension      Family History  Problem Relation Age of Onset  . Diabetes Mother   . Congestive Heart Failure Mother   . Kidney cancer Father   . Diabetes Father      Past Surgical History:  Procedure Laterality Date  . ABDOMINAL HYSTERECTOMY    . WRIST FRACTURE SURGERY Right     Social History   Socioeconomic History  . Marital status: Single    Spouse name: Not on file  . Number of children: 1  . Years of education: college  . Highest education level: Not on file  Occupational History  . Occupation: Retired  Engineer, productionocial Needs  . Financial resource strain: Not on file  . Food insecurity    Worry: Not on file    Inability: Not on file  . Transportation needs    Medical: Not on file    Non-medical: Not on file  Tobacco Use  . Smoking status: Current Every Day Smoker    Packs/day: 1.50    Types: Cigarettes  . Smokeless tobacco: Never Used  Substance and Sexual Activity  . Alcohol use: No    Alcohol/week: 0.0 standard drinks  . Drug use: No  . Sexual activity: Never  Lifestyle  . Physical activity    Days per week: Not on file  Minutes per session: Not on file  . Stress: Not on file  Relationships  . Social Musicianconnections    Talks on phone: Not on file    Gets together: Not on file    Attends religious service: Not on file    Active member of club or organization: Not on file    Attends meetings of clubs or organizations: Not on file    Relationship status: Not on file  . Intimate partner violence    Fear of current or ex partner: Not on file    Emotionally abused: Not on file    Physically abused: Not on file    Forced sexual activity: Not on file  Other Topics Concern  . Not on file  Social History Narrative   Drinks 2 cups of coffee a day      Allergies  Allergen Reactions  . Sulfa Drugs Cross Reactors Swelling      Outpatient Medications Prior to Visit  Medication Sig Dispense Refill  . albuterol (PROAIR HFA) 108 (90 Base) MCG/ACT inhaler Inhale 2 puffs into the lungs every 6 (six) hours as needed for wheezing or shortness of breath. 18 g 1  . Spacer/Aero-Holding Chambers (AEROCHAMBER PLUS) inhaler Use as instructed 1 each 2  . spironolactone (ALDACTONE) 50 MG tablet TK 1 T PO QD  4  . fluticasone (FLONASE) 50 MCG/ACT nasal spray Place 2 sprays into both nostrils daily. (Patient not taking: Reported on 01/12/2019) 16 g 0   No facility-administered medications prior to visit.     Review of Systems  Constitutional: Negative for chills, fever, malaise/fatigue and weight loss.  HENT: Negative for hearing loss, sore throat and tinnitus.   Eyes: Negative for blurred vision and double vision.  Respiratory: Positive for shortness of breath. Negative for cough, hemoptysis, sputum production, wheezing and stridor.   Cardiovascular: Negative for chest pain, palpitations, orthopnea, leg swelling and PND.  Gastrointestinal: Negative for abdominal pain, constipation, diarrhea, heartburn, nausea and vomiting.  Genitourinary: Negative for dysuria, hematuria and urgency.  Musculoskeletal: Negative for joint pain and myalgias.  Skin: Negative for itching and rash.  Neurological: Negative for dizziness, tingling, weakness and headaches.  Endo/Heme/Allergies: Negative for environmental allergies. Does not bruise/bleed easily.  Psychiatric/Behavioral: Negative for depression. The patient is not nervous/anxious and does not have insomnia.   All other systems reviewed and are negative.    Objective:  Physical Exam Vitals signs reviewed.  Constitutional:      General: She is not in acute distress.    Appearance: She is well-developed. She is obese.  HENT:     Head: Normocephalic and atraumatic.  Eyes:     General: No scleral icterus.    Conjunctiva/sclera: Conjunctivae normal.     Pupils: Pupils are equal, round,  and reactive to light.  Neck:     Musculoskeletal: Neck supple.     Vascular: No JVD.     Trachea: No tracheal deviation.  Cardiovascular:     Rate and Rhythm: Normal rate and regular rhythm.     Heart sounds: Normal heart sounds. No murmur.  Pulmonary:     Effort: Pulmonary effort is normal. No tachypnea, accessory muscle usage or respiratory distress.     Breath sounds: Normal breath sounds. No stridor. No wheezing, rhonchi or rales.  Abdominal:     General: Bowel sounds are normal. There is no distension.     Palpations: Abdomen is soft.     Tenderness: There is no abdominal tenderness.  Musculoskeletal:  General: No tenderness.  Lymphadenopathy:     Cervical: No cervical adenopathy.  Skin:    General: Skin is warm and dry.     Capillary Refill: Capillary refill takes less than 2 seconds.     Findings: No rash.  Neurological:     Mental Status: She is alert and oriented to person, place, and time.  Psychiatric:        Behavior: Behavior normal.      Vitals:   01/12/19 1131  BP: 128/88  Pulse: 79  Temp: 97.6 F (36.4 C)  TempSrc: Oral  SpO2: 99%  Weight: 238 lb (108 kg)  Height: 5' 4.5" (1.638 m)   99% on RA BMI Readings from Last 3 Encounters:  01/12/19 40.22 kg/m  07/05/18 39.48 kg/m  03/06/18 41.88 kg/m   Wt Readings from Last 3 Encounters:  01/12/19 238 lb (108 kg)  07/05/18 230 lb (104.3 kg)  03/06/18 244 lb (110.7 kg)     CBC    Component Value Date/Time   WBC 10.8 02/11/2015 1902   RBC 5.01 02/11/2015 1902   HGB 14.7 02/11/2015 1902   HCT 44.1 02/11/2015 1902   PLT 197 02/11/2015 1902   MCV 88.0 02/11/2015 1902   MCH 29.3 02/11/2015 1902   MCHC 33.3 02/11/2015 1902   RDW 14.1 02/11/2015 1902   LYMPHSABS 3.1 02/11/2015 1902   MONOABS 0.4 02/11/2015 1902   EOSABS 0.3 02/11/2015 1902   BASOSABS 0.1 02/11/2015 1902     Chest Imaging: 01/14/2017: Lung cancer screening CT 3.3 left lower lobe subpleural nodule Moderate  centrilobular emphysema.  Pulmonary Functions Testing Results: No flowsheet data found.  FeNO: None   Pathology: None   Echocardiogram: None   Heart Catheterization: None     Assessment & Plan:     ICD-10-CM   1. Centrilobular emphysema (HCC)  J43.2   2. Smoker  F17.200   3. Nodule of lower lobe of left lung  R91.1     Discussion: This is a 63 year old female longstanding history of smoking.  Current smoker.  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  She was a former patient of our lung cancer screening program and is currently 1 year off cycle from her annual exams.  At this time I think we should enroll her in our lung cancer screening program again to ensure that she has continued annual follow-up.  Today in the office we discussed various methods for smoking cessation.  I think the one that most aligns with her would be a tapering method.  We discussed options in this.  We also discussed risk benefits and alternatives of starting Chantix.  A prescription was sent to her pharmacy for the Chantix starter pack.   We will also have full pulmonary function test completed to establish a potential diagnosis of underlying COPD.  She does have centrilobular emphysema on CT imaging.  We reviewed this with the patient today in the office.  Patient to return to clinic in approximately 2 weeks or next available for pulmonary function test and can be seen by Kandice RobinsonsSarah Groce, NP.  She has seen her in the past before and really enjoyed her visit.  I will be glad to see her in 3 to 4 months from now to see how she is doing with her smoking cessation.  Greater than 50% of this patient's 45-minute office visit was spent face-to-face discussing will  be of recommendations and treatment plan as well as review of prior medical records, review of CT imaging, counseling on smoking cessation.   Current Outpatient  Medications:  .  albuterol (PROAIR HFA) 108 (90 Base) MCG/ACT inhaler, Inhale 2 puffs into the lungs every 6 (six) hours as needed for wheezing or shortness of breath., Disp: 18 g, Rfl: 1 .  Spacer/Aero-Holding Chambers (AEROCHAMBER PLUS) inhaler, Use as instructed, Disp: 1 each, Rfl: 2 .  spironolactone (ALDACTONE) 50 MG tablet, TK 1 T PO QD, Disp: , Rfl: 4   Garner Belford, DO [facility] 01/12/2019 11:37 AM

## 2019-01-14 ENCOUNTER — Other Ambulatory Visit: Payer: Self-pay | Admitting: *Deleted

## 2019-01-14 DIAGNOSIS — Z87891 Personal history of nicotine dependence: Secondary | ICD-10-CM

## 2019-01-14 DIAGNOSIS — F17210 Nicotine dependence, cigarettes, uncomplicated: Secondary | ICD-10-CM

## 2019-01-14 DIAGNOSIS — Z122 Encounter for screening for malignant neoplasm of respiratory organs: Secondary | ICD-10-CM

## 2019-01-26 ENCOUNTER — Other Ambulatory Visit: Payer: Self-pay | Admitting: Pulmonary Disease

## 2019-01-28 ENCOUNTER — Ambulatory Visit: Payer: Medicare Other | Admitting: Acute Care

## 2019-01-31 ENCOUNTER — Other Ambulatory Visit (HOSPITAL_COMMUNITY)
Admission: RE | Admit: 2019-01-31 | Discharge: 2019-01-31 | Disposition: A | Discharge status: Home / Self Care | Payer: Medicare Other | Source: Ambulatory Visit | Attending: Pulmonary Disease | Admitting: Pulmonary Disease

## 2019-01-31 DIAGNOSIS — Z20828 Contact with and (suspected) exposure to other viral communicable diseases: Secondary | ICD-10-CM | POA: Insufficient documentation

## 2019-01-31 DIAGNOSIS — Z01812 Encounter for preprocedural laboratory examination: Secondary | ICD-10-CM | POA: Insufficient documentation

## 2019-01-31 LAB — SARS CORONAVIRUS 2 (TAT 6-24 HRS): SARS Coronavirus 2: NEGATIVE

## 2019-02-04 ENCOUNTER — Encounter: Payer: Self-pay | Admitting: Acute Care

## 2019-02-04 ENCOUNTER — Ambulatory Visit (INDEPENDENT_AMBULATORY_CARE_PROVIDER_SITE_OTHER): Payer: Medicare Other | Admitting: Pulmonary Disease

## 2019-02-04 ENCOUNTER — Ambulatory Visit (INDEPENDENT_AMBULATORY_CARE_PROVIDER_SITE_OTHER): Payer: Medicare Other | Admitting: Acute Care

## 2019-02-04 ENCOUNTER — Other Ambulatory Visit: Payer: Self-pay

## 2019-02-04 DIAGNOSIS — R911 Solitary pulmonary nodule: Secondary | ICD-10-CM

## 2019-02-04 DIAGNOSIS — F17200 Nicotine dependence, unspecified, uncomplicated: Secondary | ICD-10-CM

## 2019-02-04 DIAGNOSIS — R0982 Postnasal drip: Secondary | ICD-10-CM

## 2019-02-04 DIAGNOSIS — R0600 Dyspnea, unspecified: Secondary | ICD-10-CM | POA: Diagnosis not present

## 2019-02-04 DIAGNOSIS — F17210 Nicotine dependence, cigarettes, uncomplicated: Secondary | ICD-10-CM | POA: Diagnosis not present

## 2019-02-04 DIAGNOSIS — J432 Centrilobular emphysema: Secondary | ICD-10-CM

## 2019-02-04 DIAGNOSIS — Z720 Tobacco use: Secondary | ICD-10-CM

## 2019-02-04 LAB — PULMONARY FUNCTION TEST
DL/VA % pred: 118 %
DL/VA: 5 ml/min/mmHg/L
DLCO unc % pred: 98 %
DLCO unc: 19.94 ml/min/mmHg
FEF 25-75 Post: 2.09 L/sec
FEF 25-75 Pre: 2.02 L/sec
FEF2575-%Change-Post: 3 %
FEF2575-%Pred-Post: 88 %
FEF2575-%Pred-Pre: 86 %
FEV1-%Change-Post: 1 %
FEV1-%Pred-Post: 76 %
FEV1-%Pred-Pre: 75 %
FEV1-Post: 1.96 L
FEV1-Pre: 1.92 L
FEV1FVC-%Change-Post: 2 %
FEV1FVC-%Pred-Pre: 104 %
FEV6-%Change-Post: 0 %
FEV6-%Pred-Post: 73 %
FEV6-%Pred-Pre: 73 %
FEV6-Post: 2.34 L
FEV6-Pre: 2.35 L
FEV6FVC-%Pred-Post: 103 %
FEV6FVC-%Pred-Pre: 103 %
FVC-%Change-Post: 0 %
FVC-%Pred-Post: 70 %
FVC-%Pred-Pre: 71 %
FVC-Post: 2.34 L
FVC-Pre: 2.35 L
Post FEV1/FVC ratio: 84 %
Post FEV6/FVC ratio: 100 %
Pre FEV1/FVC ratio: 82 %
Pre FEV6/FVC Ratio: 100 %
RV % pred: 91 %
RV: 1.81 L
TLC % pred: 86 %
TLC: 4.37 L

## 2019-02-04 NOTE — Progress Notes (Signed)
History of Present Illness Judy Nguyen is a 63 y.o. female current every day smoker greater than a 50 pack year smoking history with COPD, history of asthma chronic  depression, and hypertension. She is followed by Dr. Valeta Harms. She was referred by her PCP Dr. Shelia Media 01/2019. She is also followed for Lung Cancer Screening through the [employer]. Last scan was 2018>> Lung RADs 2>> She did not follow up for her annual screening.   02/04/2019 Follow up after PFT's Pt. Presents for follow up. She was last seen by Dr. Valeta Harms on 01/12/2019. She states she has been doing well. She had a cold in early March and she was short of breath , but she feels her dyspnea has improved. She states she has not had to use her rescue inhaler in 3 weeks or more. She states smoking does trigger her chest tightness. She also has noted that walking up 3-4 flights of stairs can worsen her chest tightness.She states she is not currently coughing. She does do a lot of throat clearing. She states she has terrible post nasal gtt. She states this is worse in the morning before she eats. She states she did have some issues with reflux , but states it is not too bad.  She is smoking 1 pack per day. She has just had approval for her Chantix through her insurance. She plans on starting that this weekend. We reviewed her PFT's which are + for mild restriction. We discussed the need to quit smoking, and to lose weight to improve her dyspnea.She denies fever, chest pain, orthopnea or hemoptysis  Test Results: PFT 02/04/2019 FVC (L) 2.35 3.31 71 2.34 70 +0 FEV1 (L) 1.92 2.56 75 1.96 76 +1 FEV1/FVC (%) 82 78 104 84 107 +2 FEV6 (L) 2.35 3.19 73 2.34 73 +0 FEV1/FEV6 (%) 82 81 101 82 101 +0 FEF 25-75% (L/sec) 2.02 2.35 86 2.09 88 +3 FEF Max (L/sec) 5.47 6.29 86 5.45 86 +0 FIVC (L) 2.51 2.39 -4 FIF Max (L/sec) 4.27 5.46 +27 ---- LUNG VOLUMES ---- SVC (L) 2.55 3.31 77 IC (L) 2.26 2.20 102 RV (Pleth) (L) 1.81 1.99 91 TLC  (Pleth) (L) 4.37 5.05 86 RV/TLC (Pleth) (%) 42 39 105 ERV (L) 0.30 1.11 26 ---- DIFFUSION ---- DLCOunc (ml/min/mmHg) 19.94 20.22 98 DLCOcor (ml/min/mmHg) 20.22 DL/VA (ml/min/mmHg/L) 5.00 4.21 118 VA (L) 3.99 4.80 83 ---- AIRWAYS RESISTANCE -- Raw (cmH2O/L/s) 0.94 1.86 50 Gaw (L/s/cmH2O) 1.09 1.03 106 sRaw (cmH2O*s) 2.66 < 4.76 sGaw (1/cmH2O*s) 0.38 0.20 185  01/2017 LDCT Lung-RADS 2, benign appearance or behavior. Continue annual screening with low-dose chest CT without contrast in 12 months. 2.  Aortic Atherosclerosis (ICD10-I70.0).    CBC Latest Ref Rng & Units 02/11/2015 09/17/2011  WBC 3.6 - 11.0 K/uL 10.8 10.7(H)  Hemoglobin 12.0 - 16.0 g/dL 14.7 14.2  Hematocrit 35.0 - 47.0 % 44.1 42.2  Platelets 150 - 440 K/uL 197 215    BMP Latest Ref Rng & Units 02/11/2015 09/17/2011  Glucose 65 - 99 mg/dL 134(H) 115(H)  BUN 6 - 20 mg/dL 11 11  Creatinine 0.44 - 1.00 mg/dL 0.90 0.73  Sodium 135 - 145 mmol/L 139 139  Potassium 3.5 - 5.1 mmol/L 3.2(L) 3.2(L)  Chloride 101 - 111 mmol/L 99(L) 101  CO2 22 - 32 mmol/L 32 27  Calcium 8.9 - 10.3 mg/dL 9.4 9.5    BNP No results found for: BNP  ProBNP No results found for: PROBNP  PFT    Component  Value Date/Time   FEV1PRE 1.92 02/04/2019 1241   FEV1POST 1.96 02/04/2019 1241   FVCPRE 2.35 02/04/2019 1241   FVCPOST 2.34 02/04/2019 1241   TLC 4.37 02/04/2019 1241   DLCOUNC 19.94 02/04/2019 1241   PREFEV1FVCRT 82 02/04/2019 1241   PSTFEV1FVCRT 84 02/04/2019 1241    No results found.   Past medical hx Past Medical History:  Diagnosis Date  . Asthma   . Chronic back pain   . Depression   . Hypertension      Social History   Tobacco Use  . Smoking status: Current Every Day Smoker    Packs/day: 1.50    Years: 45.00    Pack years: 67.50    Types: Cigarettes    Start date: [id number]  . Smokeless tobacco: Never Used  Substance Use Topics  . Alcohol use: No    Alcohol/week: 0.0 standard drinks  . Drug use: No    Ms.Judy Booneash  reports that she has been smoking cigarettes. She started smoking about 45 years ago. She has a 67.50 pack-year smoking history. She has never used smokeless tobacco. She reports that she does not drink alcohol or use drugs.  Tobacco Cessation: Current every day smoker , she smokes 1.5 PPD. ( 67.5 pack years)  Past surgical hx, Family hx, Social hx all reviewed.  Current Outpatient Medications on File Prior to Visit  Medication Sig  . albuterol (PROAIR HFA) 108 (90 Base) MCG/ACT inhaler Inhale 2 puffs into the lungs every 6 (six) hours as needed for wheezing or shortness of breath.  . Spacer/Aero-Holding Chambers (AEROCHAMBER PLUS) inhaler Use as instructed  . spironolactone (ALDACTONE) 50 MG tablet TK 1 T PO QD  . varenicline (CHANTIX PAK) 0.5 MG X 11 & 1 MG X 42 tablet Take one 0.5 mg tablet by mouth once daily for 3 days, then increase to one 0.5 mg tablet twice daily for 4 days, then increase to one 1 mg tablet twice daily.   No current facility-administered medications on file prior to visit.      Allergies  Allergen Reactions  . Sulfa Drugs Cross Reactors Swelling    Review Of Systems:  Constitutional:   No  weight loss, night sweats,  Fevers, chills, fatigue, or  lassitude.  HEENT:   No headaches,  Difficulty swallowing,  Tooth/dental problems, or  Sore throat,                No sneezing, itching, ear ache, nasal congestion,+ post nasal drip,   CV:  No chest pain,  Orthopnea, PND, swelling in lower extremities, anasarca, dizziness, palpitations, syncope.   GI  No heartburn, indigestion, abdominal pain, nausea, vomiting, diarrhea, change in bowel habits, loss of appetite, bloody stools.   Resp: + shortness of breath with exertion or at rest.  No excess mucus, no productive cough,  No non-productive cough,  No coughing up of blood.  No change in color of mucus.  No wheezing.  No chest wall deformity  Skin: no rash or lesions.  GU: no dysuria, change in color of urine, no  urgency or frequency.  No flank pain, no hematuria   MS:  No joint pain or swelling.  No decreased range of motion.  No back pain.  Psych:  No change in mood or affect. No depression or anxiety.  No memory loss.   Vital Signs BP 120/74 (BP Location: Left Arm, Cuff Size: Large)   Pulse 76   Temp 97.9 F (36.6 C) (Oral)   Ht  5\' 4"  (1.626 m)   Wt 239 lb (108.4 kg)   SpO2 96%   BMI 41.02 kg/m    Physical Exam:  General- No distress,  A&Ox3, pleasant ENT: No sinus tenderness, TM clear, pale nasal mucosa, no oral exudate,+ post nasal drip, no LAN Cardiac: S1, S2, regular rate and rhythm, no murmur Chest: No wheeze/ rales/ dullness; no accessory muscle use, no nasal flaring, no sternal retractions Abd.: Soft Non-tender, ND, BS +, Body mass index is 41.02 kg/m. Ext: No clubbing cyanosis, edema Neuro:  normal strength, MAE x 4, A&O x 3 Skin: No rashes, No lesions, warm and dry Psych: normal mood and behavior   Assessment/Plan  Dyspnea Dyspnea  Current every day smoker PFT's indicate mild restriction, no obstruction Plan No indication for inhaler therapy Use your rescue inhaler for breakthrough shortness of breath or wheezing up to 3 times daily as needed. Work on weight loss as this can also help with shortness of breath and deconditioning. Please be proactive in coming to see [place] at the start of any flare, or upper respiratory illness.  Please work on quitting smoking Flu vaccine as you have scheduled 02/2019 Let us know if there is anything we can do to help you in your smoking cessation journey. Please contact office for sooner follow up if symptoms do not improve or worsen or seek emergency care       Post-nasal drip We will give you a GERD diet . Avoid these foods as they can cause reflux.  We will add Zyrtec 10 mg daily for control of your post nasal drip. For worsening GERD, use OTC Protonix as needed. Work on weight loss as this can also help with shortness of  breath and deconditioning. Follow up with Dr. Tonia Brooms or Maralyn Sago NP in 3 months Please call us sooner if you need [place]. Please contact office for sooner follow up if symptoms do not improve or worsen or seek emergen   Tobacco abuse CT Chest is already schedule for 02/16/2019.  We will call you with results. Please work on quitting smoking Start Chantix therapy now that your insurance has approved your medication. Let us know if there is anything we can do to help you in your smoking cessation journey. Follow up with Dr. Tonia Brooms or Maralyn Sago NP in 3 months Please call us sooner if you need [place]. Please contact office for sooner follow up if symptoms do not improve or worsen or seek emergen    Bevelyn Ngo, NP 02/04/2019  5:05 PM

## 2019-02-04 NOTE — Progress Notes (Signed)
PFT done today. 

## 2019-02-04 NOTE — Assessment & Plan Note (Signed)
We will give you a GERD diet . Avoid these foods as they can cause reflux.  We will add Zyrtec 10 mg daily for control of your post nasal drip. For worsening GERD, use OTC Protonix as needed. Work on weight loss as this can also help with shortness of breath and deconditioning. Follow up with Dr. Valeta Harms or Judson Roch NP in 3 months Please call us sooner if you need [place]. Please contact office for sooner follow up if symptoms do not improve or worsen or seek emergen

## 2019-02-04 NOTE — Assessment & Plan Note (Signed)
CT Chest is already schedule for 02/16/2019.  We will call you with results. Please work on quitting smoking Start Chantix therapy now that your insurance has approved your medication. Let us know if there is anything we can do to help you in your smoking cessation journey. Follow up with Dr. Valeta Harms or Judson Roch NP in 3 months Please call us sooner if you need [place]. Please contact office for sooner follow up if symptoms do not improve or worsen or seek emergen

## 2019-02-04 NOTE — Assessment & Plan Note (Addendum)
Dyspnea  Current every day smoker PFT's indicate mild restriction, no obstruction Plan No indication for inhaler therapy Use your rescue inhaler for breakthrough shortness of breath or wheezing up to 3 times daily as needed. Work on weight loss as this can also help with shortness of breath and deconditioning. Please be proactive in coming to see [place] at the start of any flare, or upper respiratory illness.  Please work on quitting smoking Flu vaccine as you have scheduled 02/2019 Let us know if there is anything we can do to help you in your smoking cessation journey. Please contact office for sooner follow up if symptoms do not improve or worsen or seek emergency care

## 2019-02-04 NOTE — Patient Instructions (Addendum)
It is good to see you today. We will give you a GERD diet . Avoid these foods as they can cause reflux.  Your Pulmonary Function Tests show some mild restriction. We will add Zyrtec 10 mg daily for control of your post nasal drip. For worsening GERD, use OTC Protonix as needed. Use your rescue inhaler for breakthrough shortness of breath or wheezing up to 3 times daily as needed. Work on weight loss as this can also help with shortness of breath and deconditioning. Please be proactive in coming to see [place] at the start of any flare, or upper respiratory illness.  CT Chest is already schedule for 02/16/2019.  We will call you with results. Please work on quitting smoking Start Chantix therapy now that your insurance has approved your medication. Let us know if there is anything we can do to help you in your smoking cessation journey. Flu vaccine is scheduled 9/29 by Dr. Shelia Media. Follow up with Dr. Valeta Harms or Judson Roch NP in 3 months Please call us sooner if you need [place]. Please contact office for sooner follow up if symptoms do not improve or worsen or seek emergency care

## 2019-02-16 ENCOUNTER — Other Ambulatory Visit: Payer: Self-pay

## 2019-02-16 ENCOUNTER — Ambulatory Visit (INDEPENDENT_AMBULATORY_CARE_PROVIDER_SITE_OTHER)
Admission: RE | Admit: 2019-02-16 | Discharge: 2019-02-16 | Disposition: A | Discharge status: Home / Self Care | Payer: Medicare Other | Source: Ambulatory Visit | Attending: Acute Care | Admitting: Acute Care

## 2019-02-16 DIAGNOSIS — F17210 Nicotine dependence, cigarettes, uncomplicated: Secondary | ICD-10-CM | POA: Diagnosis not present

## 2019-02-16 DIAGNOSIS — Z122 Encounter for screening for malignant neoplasm of respiratory organs: Secondary | ICD-10-CM

## 2019-02-16 DIAGNOSIS — Z87891 Personal history of nicotine dependence: Secondary | ICD-10-CM | POA: Diagnosis not present

## 2019-02-20 ENCOUNTER — Other Ambulatory Visit: Payer: Self-pay | Admitting: *Deleted

## 2019-02-20 DIAGNOSIS — Z122 Encounter for screening for malignant neoplasm of respiratory organs: Secondary | ICD-10-CM

## 2019-02-20 DIAGNOSIS — F17210 Nicotine dependence, cigarettes, uncomplicated: Secondary | ICD-10-CM

## 2019-02-20 DIAGNOSIS — Z87891 Personal history of nicotine dependence: Secondary | ICD-10-CM

## 2019-04-07 ENCOUNTER — Other Ambulatory Visit: Payer: Self-pay | Admitting: Internal Medicine

## 2019-04-07 DIAGNOSIS — N6320 Unspecified lump in the left breast, unspecified quadrant: Secondary | ICD-10-CM

## 2019-04-20 ENCOUNTER — Other Ambulatory Visit: Payer: Medicare Other

## 2019-04-29 ENCOUNTER — Other Ambulatory Visit: Payer: Self-pay

## 2019-04-29 ENCOUNTER — Ambulatory Visit: Payer: Medicare Other

## 2019-04-29 ENCOUNTER — Ambulatory Visit
Admission: RE | Admit: 2019-04-29 | Discharge: 2019-04-29 | Disposition: A | Discharge status: Home / Self Care | Payer: Medicare Other | Source: Ambulatory Visit | Attending: Internal Medicine | Admitting: Internal Medicine

## 2019-04-29 DIAGNOSIS — N6320 Unspecified lump in the left breast, unspecified quadrant: Secondary | ICD-10-CM

## 2019-05-21 ENCOUNTER — Encounter: Payer: Self-pay | Admitting: Pulmonary Disease

## 2019-05-21 ENCOUNTER — Encounter (INDEPENDENT_AMBULATORY_CARE_PROVIDER_SITE_OTHER): Payer: Medicare Other | Admitting: Pulmonary Disease

## 2019-05-21 ENCOUNTER — Other Ambulatory Visit: Payer: Self-pay

## 2019-05-22 ENCOUNTER — Other Ambulatory Visit: Payer: Self-pay

## 2019-05-22 ENCOUNTER — Encounter: Payer: Self-pay | Admitting: Pulmonary Disease

## 2019-05-22 ENCOUNTER — Ambulatory Visit (INDEPENDENT_AMBULATORY_CARE_PROVIDER_SITE_OTHER): Payer: Medicare Other | Admitting: Pulmonary Disease

## 2019-05-22 DIAGNOSIS — Z720 Tobacco use: Secondary | ICD-10-CM

## 2019-05-22 DIAGNOSIS — F17210 Nicotine dependence, cigarettes, uncomplicated: Secondary | ICD-10-CM

## 2019-05-22 DIAGNOSIS — R0600 Dyspnea, unspecified: Secondary | ICD-10-CM

## 2019-05-22 DIAGNOSIS — J432 Centrilobular emphysema: Secondary | ICD-10-CM | POA: Diagnosis not present

## 2019-05-22 DIAGNOSIS — Z0000 Encounter for general adult medical examination without abnormal findings: Secondary | ICD-10-CM

## 2019-05-22 DIAGNOSIS — R0982 Postnasal drip: Secondary | ICD-10-CM | POA: Diagnosis not present

## 2019-05-22 DIAGNOSIS — K219 Gastro-esophageal reflux disease without esophagitis: Secondary | ICD-10-CM

## 2019-05-22 MED ORDER — OMEPRAZOLE 20 MG PO CPDR: 20.0000 mg | DELAYED_RELEASE_CAPSULE | Freq: Every day | ORAL | 4 refills | Status: DC

## 2019-05-22 NOTE — Assessment & Plan Note (Signed)
Plan: °Start daily antihistamine °

## 2019-05-22 NOTE — Assessment & Plan Note (Signed)
Plan: Start omeprazole 20 mg daily Start to follow GERD diet Review lifestyle changes as well as AVS material regarding GERD

## 2019-05-22 NOTE — Assessment & Plan Note (Signed)
Plan: At next office visit patient would benefit from the seasonal flu vaccine as well as pneumonia vaccine as she currently has no immunizations on file

## 2019-05-22 NOTE — Assessment & Plan Note (Signed)
Patient still smoking 0.5 to 1 pack/day Patient is interested in stopping smoking in January/2021 Patient is interested in Chantix Patient is currently enrolled in the lung cancer screening program with plans to repeat in September/2021  Plan: Patient be scheduled for clinical pharmacy team visit to review smoking cessation Patient to keep follow-up in lung cancer screening program for September/2021 Follow-up with our office in 2 to 3 months

## 2019-05-22 NOTE — Patient Instructions (Addendum)
You were seen today by Coral Ceo, NP  for:   1. Centrilobular emphysema (HCC) 2. Tobacco abuse  We recommend that you stop smoking.  >>>You need to set a quit date >>>If you have friends or family who smoke, let them know you are trying to quit and not to smoke around you or in your living environment  Smoking Cessation Resources:  1 800 QUIT NOW  >>> Patient to call this resource and utilize it to help support her quit smoking >>> Keep up your hard work with stopping smoking  You can also contact the [facility] >>>For smoking cessation classes call [phone]  We do not recommend using e-cigarettes as a form of stopping smoking  You can sign up for smoking cessation support texts and information:  >>>https://smokefree.gov/smokefreetxt  Please present to our office in 1-2  weeks for an appointment with the clinical pharmacy team for:  . Smoking cessation   Repeat lung cancer screening CT in September/2021   3. Dyspnea, unspecified type  Continue to work on increasing your overall physical activity Work to reduce your body weight/BMI Follow a low carbohydrate diet You need to stop smoking!  4. Post-nasal drip  Please start taking a daily antihistamine:  >>>choose one of: zyrtec, claritin, allegra, or xyzal  >>>these are over the counter medications  >>>can choose generic option  >>>take daily  >>>this medication helps with allergies, post nasal drip, and cough   5. Gastroesophageal reflux disease, unspecified whether esophagitis present  - omeprazole (PRILOSEC) 20 MG capsule; Take 1 capsule (20 mg total) by mouth daily.  Dispense: 30 capsule; Refill: 4  Omeprazole 20 mg tablet  >>>Please take 1 tablet daily 15 minutes to 30 minutes before your first meal of the day as well as before your other medications >>>Try to take at the same time each day >>>take this medication daily  GERD management: >>>Avoid laying flat until 2 hours after  meals >>>Elevate head of the bed including entire chest >>>Reduce size of meals and amount of fat, acid, spices, caffeine and sweets >>>If you are smoking, Please stop! >>>Decrease alcohol consumption >>>Work on maintaining a healthy weight with normal BMI    We recommend today:   Meds ordered this encounter  Medications  . omeprazole (PRILOSEC) 20 MG capsule    Sig: Take 1 capsule (20 mg total) by mouth daily.    Dispense:  30 capsule    Refill:  4    Follow Up:    Return in about 2 months (around 07/23/2019), or if symptoms worsen or fail to improve, for Follow up with Elisha Headland FNP-C, Follow up with Kandice Robinsons ACAGNP-BC, Follow up with Dr. Tonia Brooms.  Please present to our office in 1-2  weeks for an appointment with the clinical pharmacy team for:  . Smoking cessation  Please do your part to reduce the spread of COVID-19:      Reduce your risk of any infection  and COVID19 by using the similar precautions used for avoiding the common cold or flu:  Marland Kitchen Wash your hands often with soap and warm water for at least 20 seconds.  If soap and water are not readily available, use an alcohol-based hand sanitizer with at least 60% alcohol.  . If coughing or sneezing, cover your mouth and nose by coughing or sneezing into the elbow areas of your shirt or coat, into a tissue or into your sleeve (not your hands). Drinda Butts A MASK when in public  .  Avoid shaking hands with others and consider head nods or verbal greetings only. . Avoid touching your eyes, nose, or mouth with unwashed hands.  . Avoid close contact with people who are sick. . Avoid places or events with large numbers of people in one location, like concerts or sporting events. . If you have some symptoms but not all symptoms, continue to monitor at home and seek medical attention if your symptoms worsen. . If you are having a medical emergency, call 911.   ADDITIONAL HEALTHCARE OPTIONS FOR PATIENTS  [facility] /  e-Visit: [email or web address]         [facility]: [phone]  [facility]: [phone]                   [facility]: [phone]     It is flu season:   >>> Best ways to protect herself from the flu: Receive the yearly flu vaccine, practice good hand hygiene washing with soap and also using hand sanitizer when available, eat a nutritious meals, get adequate rest, hydrate appropriately   Please contact the office if your symptoms worsen or you have concerns that you are not improving.   Thank you for choosing [facility] for your healthcare, and for allowing [place] to partner with you on your healthcare journey. I am thankful to be able to provide care to you today.   Elisha Headland FNP-C    Gastroesophageal Reflux Disease, Adult Gastroesophageal reflux (GER) happens when acid from the stomach flows up into the tube that connects the mouth and the stomach (esophagus). Normally, food travels down the esophagus and stays in the stomach to be digested. With GER, food and stomach acid sometimes move back up into the esophagus. You may have a disease called gastroesophageal reflux disease (GERD) if the reflux:  Happens often.  Causes frequent or very bad symptoms.  Causes problems such as damage to the esophagus. When this happens, the esophagus becomes sore and swollen (inflamed). Over time, GERD can make small holes (ulcers) in the lining of the esophagus. What are the causes? This condition is caused by a problem with the muscle between the esophagus and the stomach. When this muscle is weak or not normal, it does not close properly to keep food and acid from coming back up from the stomach. The muscle can be weak because of:  Tobacco use.  Pregnancy.  Having a certain type of hernia (hiatal hernia).  Alcohol use.  Certain foods and drinks, such as coffee, chocolate, onions, and  peppermint. What increases the risk? You are more likely to develop this condition if you:  Are overweight.  Have a disease that affects your connective tissue.  Use NSAID medicines. What are the signs or symptoms? Symptoms of this condition include:  Heartburn.  Difficult or painful swallowing.  The feeling of having a lump in the throat.  A bitter taste in the mouth.  Bad breath.  Having a lot of saliva.  Having an upset or bloated stomach.  Belching.  Chest pain. Different conditions can cause chest pain. Make sure you see your doctor if you have chest pain.  Shortness of breath or noisy breathing (wheezing).  Ongoing (chronic) cough or a cough at night.  Wearing away of the surface of teeth (tooth enamel).  Weight loss. How is this treated? Treatment will depend on how bad your symptoms are. Your doctor may suggest:  Changes to your diet.  Medicine.  Surgery. Follow these instructions at home: Eating and drinking   Follow a diet as told by your doctor. You may need to avoid foods and drinks such as: ? Coffee and tea (with or without caffeine). ? Drinks that contain alcohol. ? Energy drinks and sports drinks. ? Bubbly (carbonated) drinks or sodas. ? Chocolate and cocoa. ? Peppermint and mint flavorings. ? Garlic and onions. ? Horseradish. ? Spicy and acidic foods. These include peppers, chili powder, curry powder, vinegar, hot sauces, and BBQ sauce. ? Citrus fruit juices and citrus fruits, such as oranges, lemons, and limes. ? Tomato-based foods. These include red sauce, chili, salsa, and pizza with red sauce. ? Fried and fatty foods. These include donuts, french fries, potato chips, and high-fat dressings. ? High-fat meats. These include hot dogs, rib eye steak, sausage, ham, and bacon. ? High-fat dairy items, such as whole milk, butter, and cream cheese.  Eat small meals often. Avoid eating large meals.  Avoid drinking large amounts of liquid  with your meals.  Avoid eating meals during the 2-3 hours before bedtime.  Avoid lying down right after you eat.  Do not exercise right after you eat. Lifestyle   Do not use any products that contain nicotine or tobacco. These include cigarettes, e-cigarettes, and chewing tobacco. If you need help quitting, ask your doctor.  Try to lower your stress. If you need help doing this, ask your doctor.  If you are overweight, lose an amount of weight that is healthy for you. Ask your doctor about a safe weight loss goal. General instructions  Pay attention to any changes in your symptoms.  Take over-the-counter and prescription medicines only as told by your doctor. Do not take aspirin, ibuprofen, or other NSAIDs unless your doctor says it is okay.  Wear loose clothes. Do not wear anything tight around your waist.  Raise (elevate) the head of your bed about 6 inches (15 cm).  Avoid bending over if this makes your symptoms worse.  Keep all follow-up visits as told by your doctor. This is important. Contact a doctor if:  You have new symptoms.  You lose weight and you do not know why.  You have trouble swallowing or it hurts to swallow.  You have wheezing or a cough that keeps happening.  Your symptoms do not get better with treatment.  You have a hoarse voice. Get help right away if:  You have pain in your arms, neck, jaw, teeth, or back.  You feel sweaty, dizzy, or light-headed.  You have chest pain or shortness of breath.  You throw up (vomit) and your throw-up looks like blood or coffee grounds.  You pass out (faint).  Your poop (stool) is bloody or black.  You cannot swallow, drink, or eat. Summary  If a person has gastroesophageal reflux disease (GERD), food and stomach acid move back up into the esophagus and cause symptoms or problems such as damage to the esophagus.  Treatment will depend on how bad your symptoms are.  Follow a diet as told by your  doctor.  Take all medicines only as told by your doctor. This information is not intended to replace advice given to you by your health care provider. Make sure you discuss any questions you have with your health care provider. Document Released: 11/07/2007 Document Revised: 11/27/2017 Document Reviewed: 11/27/2017 Elsevier Patient Education  2020 [employer].   Food Choices for Gastroesophageal Reflux Disease, Adult When you have gastroesophageal reflux disease (GERD), the foods you  eat and your eating habits are very important. Choosing the right foods can help ease your discomfort. Think about working with a nutrition specialist (dietitian) to help you make good choices. What are tips for following this plan?  Meals  Choose healthy foods that are low in fat, such as fruits, vegetables, whole grains, low-fat dairy products, and lean meat, fish, and poultry.  Eat small meals often instead of 3 large meals a day. Eat your meals slowly, and in a place where you are relaxed. Avoid bending over or lying down until 2-3 hours after eating.  Avoid eating meals 2-3 hours before bed.  Avoid drinking a lot of liquid with meals.  Cook foods using methods other than frying. Bake, grill, or broil food instead.  Avoid or limit: ? Chocolate. ? Peppermint or spearmint. ? Alcohol. ? Pepper. ? Black and decaffeinated coffee. ? Black and decaffeinated tea. ? Bubbly (carbonated) soft drinks. ? Caffeinated energy drinks and soft drinks.  Limit high-fat foods such as: ? Fatty meat or fried foods. ? Whole milk, cream, butter, or ice cream. ? Nuts and nut butters. ? Pastries, donuts, and sweets made with butter or shortening.  Avoid foods that cause symptoms. These foods may be different for everyone. Common foods that cause symptoms include: ? Tomatoes. ? Oranges, lemons, and limes. ? Peppers. ? Spicy food. ? Onions and garlic. ? Vinegar. Lifestyle  Maintain a healthy weight. Ask your  doctor what weight is healthy for you. If you need to lose weight, work with your doctor to do so safely.  Exercise for at least 30 minutes for 5 or more days each week, or as told by your doctor.  Wear loose-fitting clothes.  Do not smoke. If you need help quitting, ask your doctor.  Sleep with the head of your bed higher than your feet. Use a wedge under the mattress or blocks under the bed frame to raise the head of the bed. Summary  When you have gastroesophageal reflux disease (GERD), food and lifestyle choices are very important in easing your symptoms.  Eat small meals often instead of 3 large meals a day. Eat your meals slowly, and in a place where you are relaxed.  Limit high-fat foods such as fatty meat or fried foods.  Avoid bending over or lying down until 2-3 hours after eating.  Avoid peppermint and spearmint, caffeine, alcohol, and chocolate. This information is not intended to replace advice given to you by your health care provider. Make sure you discuss any questions you have with your health care provider. Document Released: 11/20/2011 Document Revised: 09/11/2018 Document Reviewed: 06/26/2016 Elsevier Patient Education  2020 [place] Risks of Smoking Smoking cigarettes is very bad for your health. Tobacco smoke has over 200 known poisons in it. It contains the poisonous gases nitrogen oxide and carbon monoxide. There are over 60 chemicals in tobacco smoke that cause cancer. Smoking is difficult to quit because a chemical in tobacco, called nicotine, causes addiction or dependence. When you smoke and inhale, nicotine is absorbed rapidly into the bloodstream through your lungs. Both inhaled and non-inhaled nicotine may be addictive. What are the risks of cigarette smoke? Cigarette smokers have an increased risk of many serious medical problems, including:  Lung cancer.  Lung disease, such as pneumonia, bronchitis, and emphysema.  Chest pain  (angina) and heart attack because the heart is not getting enough oxygen.  Heart disease and peripheral blood vessel disease.  High blood pressure (hypertension).  Stroke.  Oral cancer, including cancer of the lip, mouth, or voice box.  Bladder cancer.  Pancreatic cancer.  Cervical cancer.  Pregnancy complications, including premature birth.  Stillbirths and smaller newborn babies, birth defects, and genetic damage to sperm.  Early menopause.  Lower estrogen level for women.  Infertility.  Facial wrinkles.  Blindness.  Increased risk of broken bones (fractures).  Senile dementia.  Stomach ulcers and internal bleeding.  Delayed wound healing and increased risk of complications during surgery.  Even smoking lightly shortens your life expectancy by several years. Because of secondhand smoke exposure, children of smokers have an increased risk of the following:  Sudden infant death syndrome (SIDS).  Respiratory infections.  Lung cancer.  Heart disease.  Ear infections. What are the benefits of quitting? There are many health benefits of quitting smoking. Here are some of them:  Within days of quitting smoking, your risk of having a heart attack decreases, your blood flow improves, and your lung capacity improves. Blood pressure, pulse rate, and breathing patterns start returning to normal soon after quitting.  Within months, your lungs may clear up completely.  Quitting for 10 years reduces your risk of developing lung cancer and heart disease to almost that of a nonsmoker.  People who quit may see an improvement in their overall quality of life. How do I quit smoking?     Smoking is an addiction with both physical and psychological effects, and longtime habits can be hard to change. Your health care provider can recommend:  Programs and community resources, which may include group support, education, or talk therapy.  Prescription medicines to help  reduce cravings.  Nicotine replacement products, such as patches, gum, and nasal sprays. Use these products only as directed. Do not replace cigarette smoking with electronic cigarettes, which are commonly called e-cigarettes. The safety of e-cigarettes is not known, and some may contain harmful chemicals.  A combination of two or more of these methods. Where to find more information  American Lung Association: www.lung.org  American Cancer Society: www.cancer.org Summary  Smoking cigarettes is very bad for your health. Cigarette smokers have an increased risk of many serious medical problems, including several cancers, heart disease, and stroke.  Smoking is an addiction with both physical and psychological effects, and longtime habits can be hard to change.  By stopping right away, you can greatly reduce the risk of medical problems for you and your family.  To help you quit smoking, your health care provider can recommend programs, community resources, prescription medicines, and nicotine replacement products such as patches, gum, and nasal sprays. This information is not intended to replace advice given to you by your health care provider. Make sure you discuss any questions you have with your health care provider. Document Released: 06/28/2004 Document Revised: 08/22/2017 Document Reviewed: 05/25/2016 Elsevier Patient Education  2020 [employer]

## 2019-05-22 NOTE — Assessment & Plan Note (Signed)
Plan: Patient needs to stop smoking Follow-up with clinical pharmacy team for smoking cessation Complete lung cancer screening CT in September/2021 Follow-up with our office in 2 to 3 months

## 2019-05-22 NOTE — Progress Notes (Signed)
Virtual Visit via Telephone Note  I connected with Judy Nguyen on 05/22/19 at  4:00 PM EST by telephone and verified that I am speaking with the correct person using two identifiers.  Location: Patient: Home Provider: [facility] - [address], [place], [place], [place] [address]   I discussed the limitations, risks, security and privacy concerns of performing an evaluation and management service by telephone and the availability of in person appointments. I also discussed with the patient that there may be a patient responsible charge related to this service. The patient expressed understanding and agreed to proceed.  Patient consented to consult via telephone: Yes People present and their role in pt care: Pt   History of Present Illness:  63 year old female current everyday smoker followed in our office for Dyspnea, Emphysema, Restrictive Lung Disease   Past medical history: Postnasal drip, GERD  Smoking history: Current everyday smoker.  1.5 packs/day, 67.5-pack-year smoking history Maintenance: None  Patient of Dr. Valeta Harms  Chief complaint: 3 month follow up, ready to work on stop smoking   63 year old female current everyday smoker followed in our office for dyspnea, emphysema and slightly restrictive lung disease likely due to body habitus.  Patient last was seen in September/2020 where she completed pulmonary function testing that did not show an obstruction, slowed slight restriction, DLCO normal.  Patient was encouraged to stop smoking and prescribed Chantix.  Patient reporting today she never started Chantix.  She reports that "life got busy and complicated".  She is interested in stopping smoking in January/2021.  She has decreased her smoking down to 0.5 to 1 pack/day.  This is a decrease from the 1.5 packs/day she was having previously.  Patient also is requesting additional information from her last office visit regarding the antihistamine and PPIs that were  recommended.  We will further discuss this today.  Patient is also currently enrolled in lung cancer screening program.  We will need to repeat lung cancer screening CT in September/2021.  Smoking assessment and cessation counseling  Patient currently smoking: 0.5 -1ppd I have advised the patient to quit/stop smoking as soon as possible due to high risk for multiple medical problems.  It will also be very difficult for [place] to manage patient's  respiratory symptoms and status if we continue to expose her lungs to a known irritant.  We do not advise e-cigarettes as a form of stopping smoking.  Patient is willing to quit smoking.  I have advised the patient that we can assist and have options of nicotine replacement therapy, provided smoking cessation education today, provided smoking cessation counseling, and provided cessation resources.  Follow-up next office visit office visit for assessment of smoking cessation.  Smoking cessation counseling advised for: 5 min    Observations/Objective:  02/17/2019-CT chest lung cancer screening-multiple tiny pulmonary nodules again noted in the lungs bilaterally, mild diffuse bronchial wall thickening and mild centrilobular and paraseptal emphysema, lung RADS 2  02/04/2019-pulmonary function test-FVC 2.35 (71% predicted), postbronchodilator ratio 84, postbronchodilator FEV1 1.96 (76% 50), DLCO 19.94 (98% predicted  Social History   Tobacco Use  Smoking Status Current Every Day Smoker  . Packs/day: 1.50  . Years: 45.00  . Pack years: 67.50  . Types: Cigarettes  . Start date: 1975  Smokeless Tobacco Never Used    There is no immunization history on file for this patient.    Assessment and Plan:  Centrilobular emphysema ([place]) Plan: Patient needs to stop smoking Follow-up with clinical pharmacy  team for smoking cessation Complete lung cancer screening CT in September/2021 Follow-up with our office in 2 to 3 months  GERD (gastroesophageal  reflux disease) Plan: Start omeprazole 20 mg daily Start to follow GERD diet Review lifestyle changes as well as AVS material regarding GERD  Post-nasal drip Plan: Start daily antihistamine  Tobacco abuse Patient still smoking 0.5 to 1 pack/day Patient is interested in stopping smoking in January/2021 Patient is interested in Chantix Patient is currently enrolled in the lung cancer screening program with plans to repeat in September/2021  Plan: Patient be scheduled for clinical pharmacy team visit to review smoking cessation Patient to keep follow-up in lung cancer screening program for September/2021 Follow-up with our office in 2 to 3 months  Healthcare maintenance Plan: At next office visit patient would benefit from the seasonal flu vaccine as well as pneumonia vaccine as she currently has no immunizations on file   Follow Up Instructions:  Return in about 2 months (around 07/23/2019), or if symptoms worsen or fail to improve, for Follow up with Elisha Headland FNP-C, Follow up with Kandice Robinsons ACAGNP-BC, Follow up with Dr. Tonia Brooms.   I discussed the assessment and treatment plan with the patient. The patient was provided an opportunity to ask questions and all were answered. The patient agreed with the plan and demonstrated an understanding of the instructions.   The patient was advised to call back or seek an in-person evaluation if the symptoms worsen or if the condition fails to improve as anticipated.  I provided 24 minutes of non-face-to-face time during this encounter.   Coral Ceo, NP

## 2019-06-03 NOTE — Progress Notes (Signed)
Subjective Patient presents to [facility] and seen by the pharmacist for smoking cessation counseling.   Patient was referred and last seen by  Elisha Headland, NP, on 05/22/2019. Of note, patient has [employer]. Bupropion is tier 1 on patient's insurance which costs $7 per month, Chantix is tier 3 on patient's insurance which costs $35 per month, and nicotine patches/gum/lozenge are not covered on patient's insurance.  Patient contacted by telephone for smoking cessation appt. She states she did not come in to appt because she may have been exposed to COVID-19.  She has not started chantix yet. "Between retirement and pandemic too many smoke breaks". She reports she smokes in the house and the car. She does not live with anyone who smokes. She typically smokes 1 full cigarette at a time.  Social History   Tobacco Use  Smoking Status Current Every Day Smoker  . Packs/day: 1.50  . Years: 45.00  . Pack years: 67.50  . Types: Cigarettes  . Start date: 1975  Smokeless Tobacco Never Used     Tobacco Use History  Age when started using tobacco on a daily basis 63 years old  Type: cigarettes.  Number of cigarettes per day 1-1.5 PPD, brand [place] 100s  Smokes first cigarette within 45 minutes after waking.  Does not wake at night to smoke  Triggers include meals, stress, boredom  Quit Attempt History   Most recent quit attempt 04/2019  Longest time ever been tobacco free 2 weeks  Methods tried in the past include "trying to cut back", "cold [place]"  Rates IMPORTANCE of quitting tobacco on 1-10 scale of 8  Rates READINESS of quitting tobacco on 1-10 scale of 7 or 8  Rates CONFIDENCE of quitting tobacco on 1-10 scale of 5  Motivators to quitting include grandson Jacquelinne Speak, 61 years old - lives in [place]); barriers include being around others that smoke (sister (trying to quit), friend), boredom    There is no immunization history on file for this  patient.   Assessment and Plan  1. Smoking Cessation -Initiated nicotine replacement tx with bupropion, nicotine patch 21 mg daily, nicotine 2 mg lozenges. Patient counseled on purpose, proper use, and potential adverse effects. Patient verbalized understanding. Patient would prefer to use bupropion > Chantix due to cost. Sent in rx for first bupropion rx to [facility] per patient's request. Patient will contact [place] [facility] for nicotine patch and nicotine lozenges since those agents are not covered via her insurance. Considering patient was contacted for tele appt rather than office visit will mail her smoking cessation handouts. Will follow up in 2 weeks (06/22/2019). Patient will also be followed by [place] [facility].  Nonpharmacologic Methods Patient enjoys cooking, reading, crafting (home decor - DIY), and will try to re-assess routine to discuss at f/u appt.  Nicotine Patch (21 mg daily) Patient counseled on purpose, proper use, and potential adverse effects, including mild itching or redness at the point of application, headache, trouble sleeping, and/or vivid dreams     Patch Schedule for >10 cigarettes daily -Weeks 1-6: one 21 mg patch daily -Weeks 7-8: one 14 mg patch daily -Weeks 9-10: one 7 mg patch daily  Nicotine Lozenge (2 mg daily) Patient counseled on purpose, proper use, and potential adverse effects including nausea, hiccups, cough, and heartburn.  Instructed patient to use  2 mg unless the smoke within 30 minutes of waking up in which they should use 4 mg.  Lozenge dosing schedule Weeks 1 to 6:  1 lozenge every 1 to 2 hours (maximum: 5 lozenges every 6 hours; 20 lozenges/day); to increase chances of quitting, use at least 9 lozenges/day during the first 6 weeks   Weeks 7 to 9: 1 lozenge every 2 to 4 hours (maximum: 5 lozenges every 6 hours; 20 lozenges/day)   Weeks 10 to 12: 1 lozenge every 4 to 8 hours (maximum: 5 lozenges every 6 hours; 20 lozenges/day)  Bupropion -Initiated  bupropion 150 mg daily x 3 days.  Patient with no PMH of seizures. Patient counseled on purpose, proper use, and potential adverse effects, including insomnia, and potential change in mood.   -Provided information on 1 800-QUIT NOW support program.   2. Medication Reconciliation A drug regimen assessment was performed, including review of allergies, interactions, disease-state management, dosing and immunization history. Medications were reviewed with the patient, including name, instructions, indication, goals of therapy, potential side effects, importance of adherence, and safe use. -No medications added/discontinued/changed.   3. Immunizations Patient is indicated for influenzae, pneumonia, and shingles vaccinations. Patient states she has received flu, pneumonia, and shingles vaccine through PCP. She received flu vaccine 03/2019.  Thank you for involving pharmacy to assist in providing Ms. Sones's care.   Drexel Iha, PharmD PGY2 Ambulatory Care Pharmacy Resident

## 2019-06-04 NOTE — Progress Notes (Signed)
PCCM: Thanks for seeing her Garner Yehle, DO [facility] 06/04/2019 1:20 PM

## 2019-06-08 ENCOUNTER — Ambulatory Visit (INDEPENDENT_AMBULATORY_CARE_PROVIDER_SITE_OTHER): Payer: Medicare Other | Admitting: Pharmacist

## 2019-06-08 DIAGNOSIS — F17210 Nicotine dependence, cigarettes, uncomplicated: Secondary | ICD-10-CM | POA: Diagnosis not present

## 2019-06-08 MED ORDER — BUPROPION HCL ER (XL) 150 MG PO TB24: 150.0000 mg | ORAL_TABLET | Freq: Every day | ORAL | 0 refills | Status: DC

## 2019-06-22 ENCOUNTER — Telehealth: Payer: Self-pay | Admitting: Pharmacist

## 2019-06-22 NOTE — Telephone Encounter (Signed)
Called patient on 06/22/2019 at 11:53 AM and left HIPAA-compliant VM with instructions to call [facility] back   Plan to discuss smoking cessation  Zachery Conch, PharmD PGY2 Ambulatory Care Pharmacy Resident

## 2019-06-22 NOTE — Telephone Encounter (Signed)
Called patient on 06/22/2019 at 3:41 PM and left HIPAA-compliant VM with instructions to call [facility] back   Plan to discuss smoking cessation  Zachery Conch, PharmD PGY2 Ambulatory Care Pharmacy Resident

## 2019-06-29 NOTE — Telephone Encounter (Signed)
Called patient on 06/29/2019 at 10:41 AM and left HIPAA-compliant VM with instructions to call [facility] back   Third unsuccessful attempt to contact patient. Will await for patient to return call to further discuss smoking cessation.  Zachery Conch, PharmD PGY2 Ambulatory Care Pharmacy Resident

## 2019-06-29 NOTE — Telephone Encounter (Signed)
Thank you for being willing to work with the patient.   Judy Nguyen

## 2019-08-09 ENCOUNTER — Ambulatory Visit: Payer: Medicare Other | Attending: Internal Medicine

## 2019-08-09 DIAGNOSIS — Z23 Encounter for immunization: Secondary | ICD-10-CM | POA: Insufficient documentation

## 2019-08-09 NOTE — Progress Notes (Signed)
   Covid-19 Vaccination Clinic  Name:  JAVIONNA LEDER    MRN: [id number] DOB: May 21, 1956  08/09/2019  Ms. Ohmann was observed post Covid-19 immunization for 15 minutes without incident. She was provided with Vaccine Information Sheet and instruction to access the V-Safe system.   Ms. Sawin was instructed to call 911 with any severe reactions post vaccine: Marland Kitchen Difficulty breathing  . Swelling of face and throat  . A fast heartbeat  . A bad rash all over body  . Dizziness and weakness   Immunizations Administered    Name Date Dose VIS Date Route   Pfizer COVID-19 Vaccine 08/09/2019 10:47 AM 0.3 mL 05/15/2019 Intramuscular   Manufacturer: [employer], [employer]   Lot: [id number]   NDC: [address]-0

## 2019-08-31 ENCOUNTER — Ambulatory Visit: Payer: Medicare Other | Attending: Internal Medicine

## 2019-08-31 DIAGNOSIS — Z23 Encounter for immunization: Secondary | ICD-10-CM

## 2019-08-31 NOTE — Progress Notes (Signed)
   Covid-19 Vaccination Clinic  Name:  Judy Nguyen    MRN: [id number] DOB: 07-19-1955  08/31/2019  Ms. Irby was observed post Covid-19 immunization for 15 minutes without incident. She was provided with Vaccine Information Sheet and instruction to access the V-Safe system.   Ms. Speaker was instructed to call 911 with any severe reactions post vaccine: Marland Kitchen Difficulty breathing  . Swelling of face and throat  . A fast heartbeat  . A bad rash all over body  . Dizziness and weakness   Immunizations Administered    Name Date Dose VIS Date Route   Pfizer COVID-19 Vaccine 08/31/2019  9:06 AM 0.3 mL 05/15/2019 Intramuscular   Manufacturer: [employer], [employer]   Lot: [id number]   NDC: [address]-6

## 2019-11-02 IMAGING — CT CT CHEST LUNG CANCER SCREENING LOW DOSE W/O CM
2 of 3 series · 15 of 36 positions shown, 18 images · non-contrast
Comparison: Low-dose lung cancer screening chest CT 01/14/2017.

CLINICAL DATA: 62-year-old female current smoker with 53 pack-year
history of smoking. Lung cancer screening examination.

EXAM:
CT CHEST WITHOUT CONTRAST LOW-DOSE FOR LUNG CANCER SCREENING
TECHNIQUE: Multidetector CT imaging of the chest was performed following the
standard protocol without IV contrast.

[Series 2: thorax 5.0 i31f 3 · axial · 0.67mm/px · z∈[-306,-50]mm · 12 of 61 slices shown, 15 images]
[im 5/61  mediastinal]
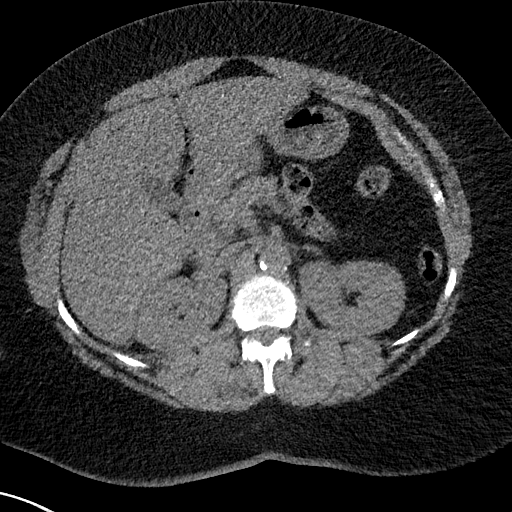
[im 5/61  lung]
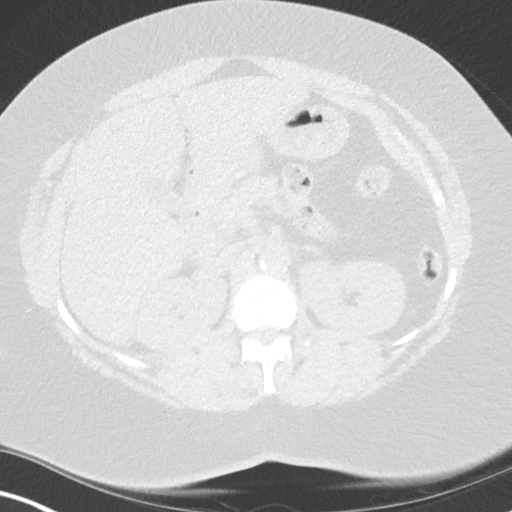
[im 9/61  lung]
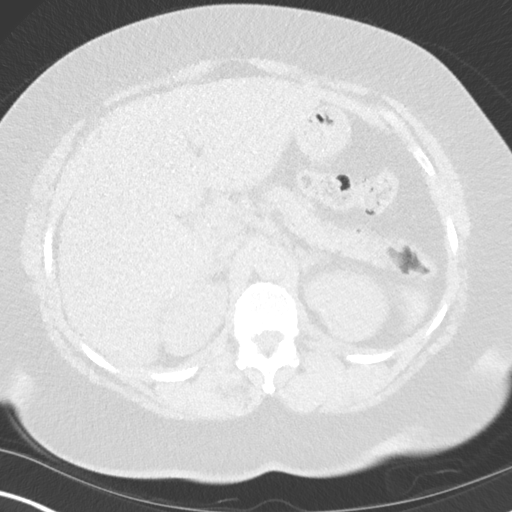
[im 14/61  lung]
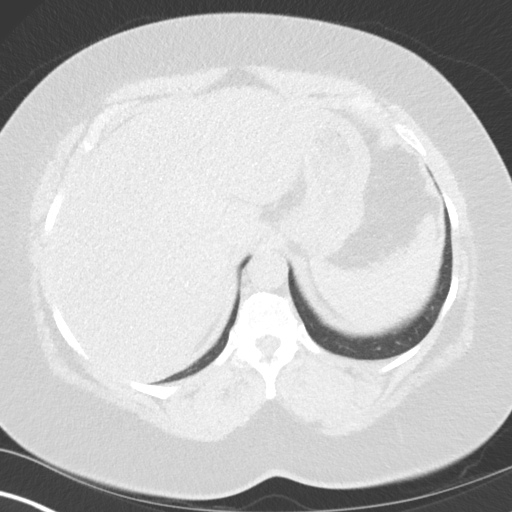
[im 18/61  lung]
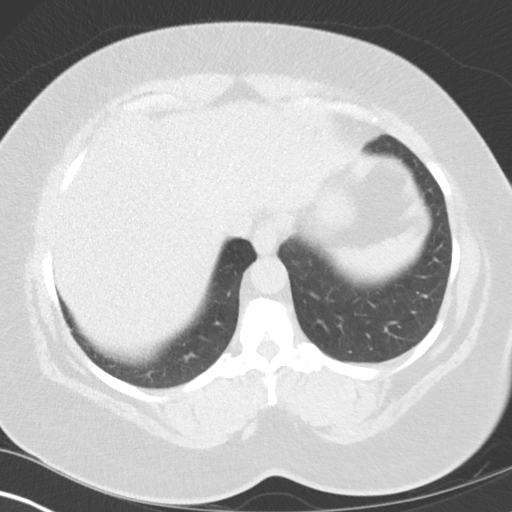
[im 23/61  mediastinal]
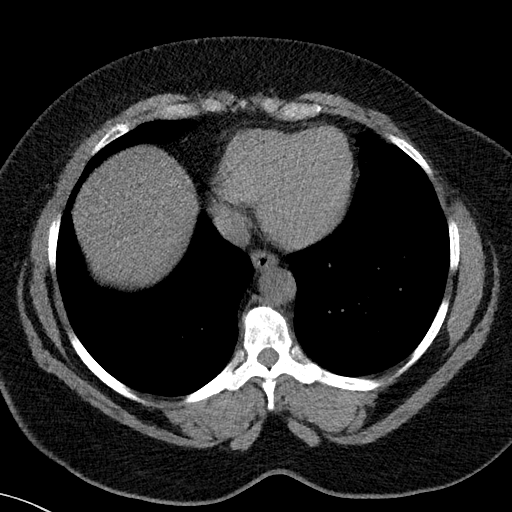
[im 23/61  lung]
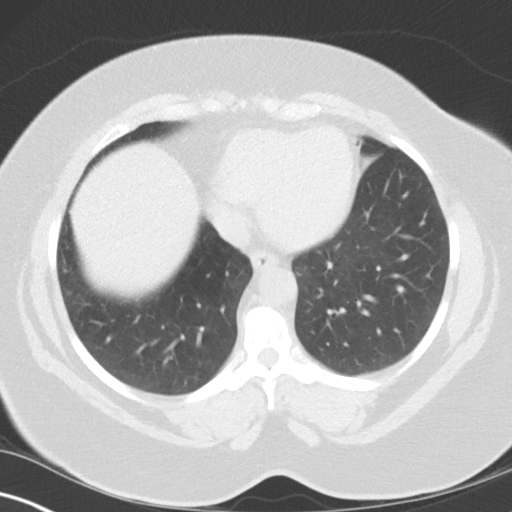
[im 27/61  lung]
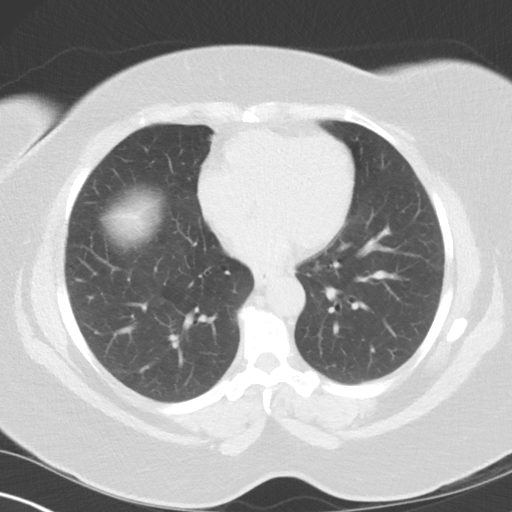
[im 34/61  lung]
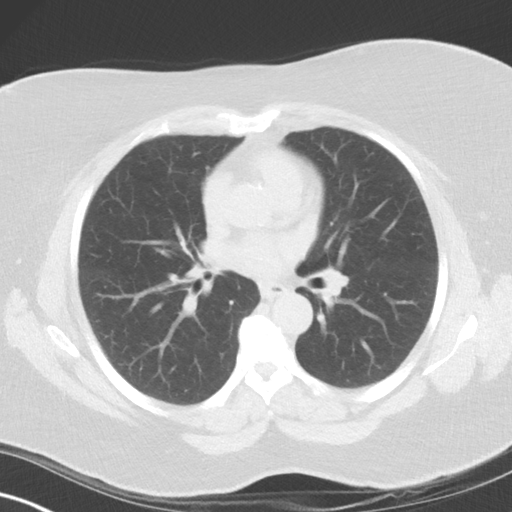
[im 38/61  lung]
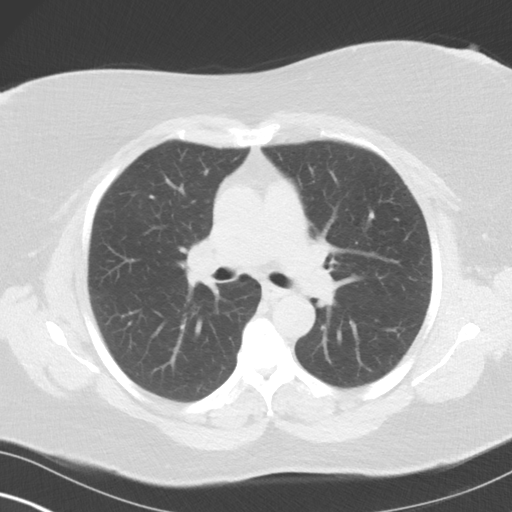
[im 43/61  mediastinal]
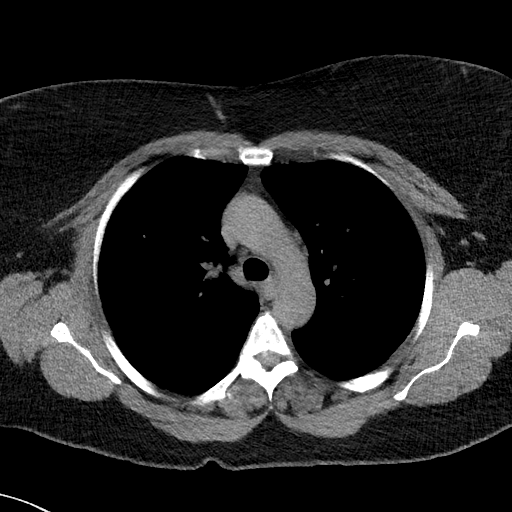
[im 43/61  lung]
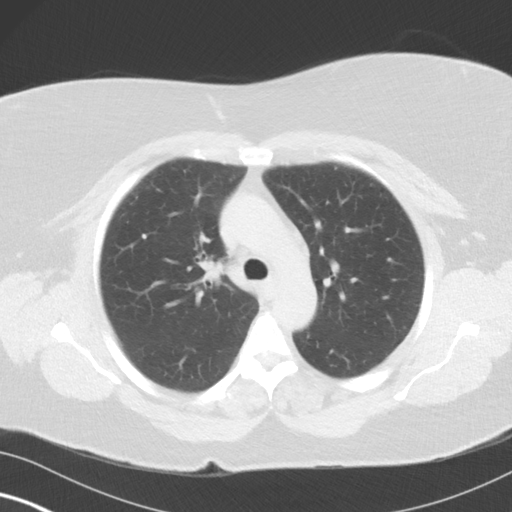
[im 47/61  lung]
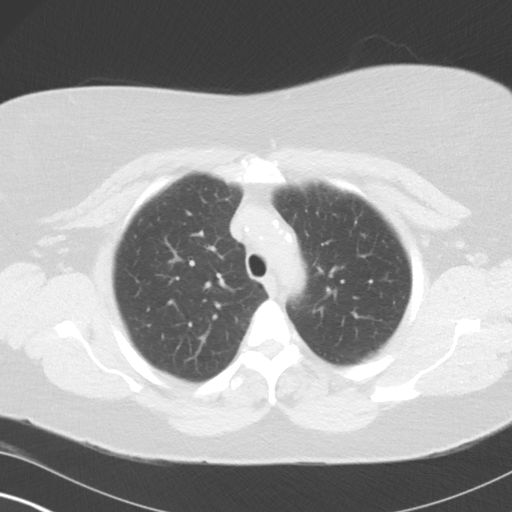
[im 52/61  lung]
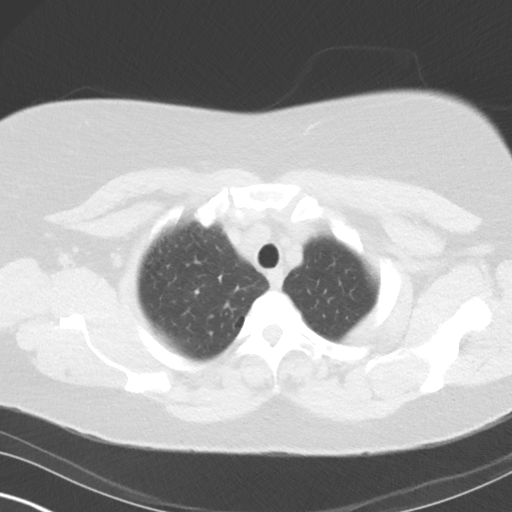
[im 56/61  lung]
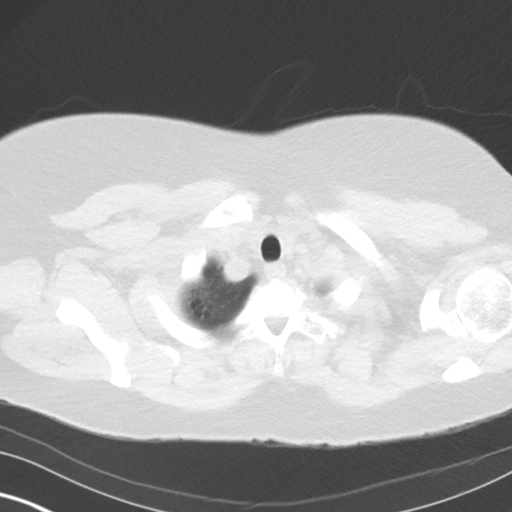

[Series 5: coronal · coronal · 0.59mm/px · 3 of 134 slices shown]
[im 27/134  lung]
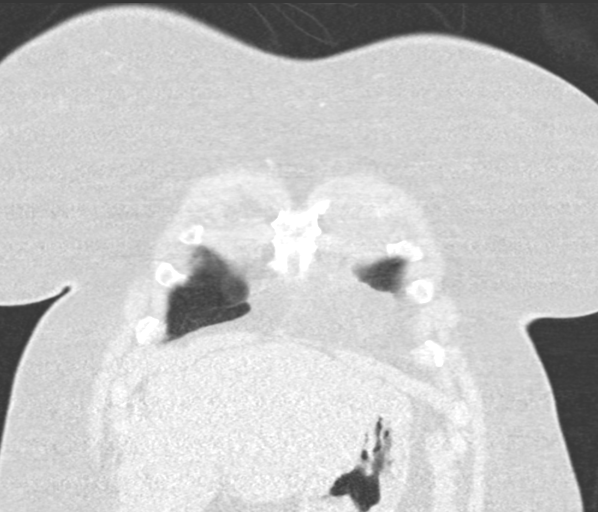
[im 54/134  lung]
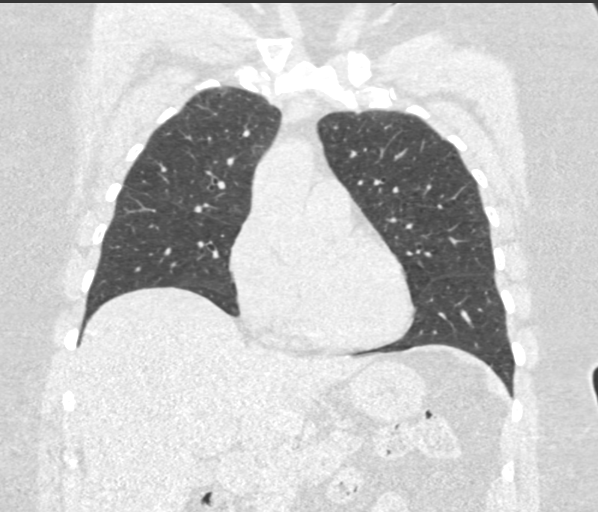
[im 80/134  lung]
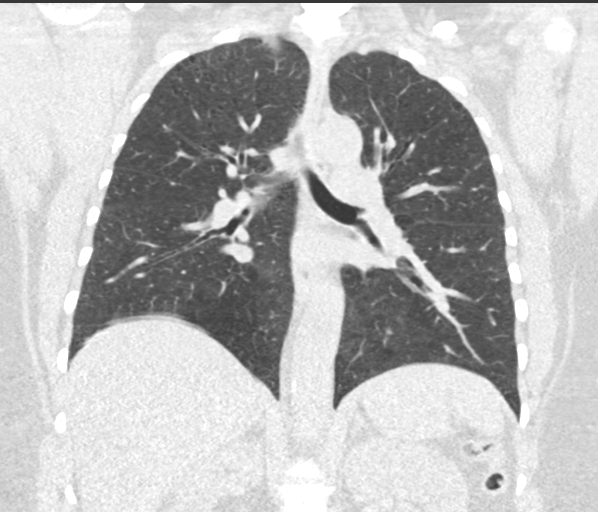

[15 of 36 positions shown; findings below may reference images not displayed]

FINDINGS: Cardiovascular: Heart size is normal. There is no significant
pericardial fluid, thickening or pericardial calcification. Aortic
atherosclerosis. No definite coronary artery calcifications. Mild
calcifications of the aortic valve.

Mediastinum/Nodes: No pathologically enlarged mediastinal or hilar
lymph nodes. Please note that accurate exclusion of hilar adenopathy
is limited on noncontrast CT scans. Esophagus is unremarkable in
appearance. No axillary lymphadenopathy.

Lungs/Pleura: Multiple tiny pulmonary nodules are again noted in the
lungs bilaterally, largest of which is in the medial aspect of the
right upper lobe (axial image 137 of series 3), with a volume
derived mean diameter of 3.4 mm. No larger more suspicious appearing
pulmonary nodules or masses are noted. No acute consolidative
airspace disease. No pleural effusions. Mild diffuse bronchial wall
thickening with mild centrilobular and paraseptal emphysema.

Upper Abdomen: Aortic atherosclerosis. Diffuse low attenuation
throughout the visualized hepatic parenchyma, indicative of hepatic
steatosis.

Musculoskeletal: Well-defined subcentimeter sclerotic lesion with
narrow zone of transition in the superior aspect of the T10
vertebral body, stable compared to the prior study, presumably a
small bone island. There are no aggressive appearing lytic or
blastic lesions noted in the visualized portions of the skeleton.
IMPRESSION: 1. Lung-RADS 2, benign appearance or behavior. Continue annual
screening with low-dose chest CT without contrast in 12 months.
2. Aortic atherosclerosis.
3. Mild diffuse bronchial wall thickening with mild centrilobular
and paraseptal emphysema; imaging findings suggestive of underlying
COPD.
4. Hepatic steatosis.

Aortic Atherosclerosis (4TVD3-L7Q.Q) and Emphysema (4TVD3-NRZ.W).

## 2020-01-22 ENCOUNTER — Other Ambulatory Visit: Payer: Self-pay

## 2020-01-22 ENCOUNTER — Ambulatory Visit
Admission: EM | Admit: 2020-01-22 | Discharge: 2020-01-22 | Disposition: A | Discharge status: Home / Self Care | Payer: Medicare Other | Attending: Family Medicine | Admitting: Family Medicine

## 2020-01-22 ENCOUNTER — Encounter: Payer: Self-pay | Admitting: Emergency Medicine

## 2020-01-22 DIAGNOSIS — L255 Unspecified contact dermatitis due to plants, except food: Secondary | ICD-10-CM | POA: Diagnosis not present

## 2020-01-22 DIAGNOSIS — R21 Rash and other nonspecific skin eruption: Secondary | ICD-10-CM | POA: Diagnosis not present

## 2020-01-22 MED ORDER — PREDNISONE 10 MG (21) PO TBPK: ORAL_TABLET | Freq: Every day | ORAL | 0 refills | Status: AC

## 2020-01-22 NOTE — ED Provider Notes (Signed)
[facility]   [id number] 01/22/20 Arrival Time: 1323  CC: RASH  SUBJECTIVE:  Judy Nguyen is a 64 y.o. female who presents with a skin complaint that began earlier this week.  Reports that she was working outside in her garden and thinks that she may have gotten into some poison ivy or poison oak.  Has been using calamine lotion with little relief.  Denies changes in soaps, detergents, close contacts with similar rash, known trigger or environmental trigger, allergy. Denies medications change or starting a new medication recently.  Localizes the rash to upper chest, upper back, shoulders.  Describes it as red, raised, itchy.  There are no aggravating or alleviating factors. Denies fever, chills, nausea, vomiting, erythema, swelling, discharge, oral lesions, SOB, chest pain, abdominal pain, changes in bowel or bladder function.    ROS: As per HPI.  All other pertinent ROS negative.     Past Medical History:  Diagnosis Date  . Asthma   . Chronic back pain   . Depression   . Hypertension    Past Surgical History:  Procedure Laterality Date  . ABDOMINAL HYSTERECTOMY    . WRIST FRACTURE SURGERY Right    Allergies  Allergen Reactions  . Sulfa Drugs Cross Reactors Swelling   No current facility-administered medications on file prior to encounter.   Current Outpatient Medications on File Prior to Encounter  Medication Sig Dispense Refill  . albuterol (PROAIR HFA) 108 (90 Base) MCG/ACT inhaler Inhale 2 puffs into the lungs every 6 (six) hours as needed for wheezing or shortness of breath. 18 g 1  . buPROPion (WELLBUTRIN XL) 150 MG 24 hr tablet Take 1 tablet (150 mg total) by mouth daily. For THREE DAYS. Then increase to 2 tablets (300mg  total) by mouth daily. 60 tablet 0  . nicotine (NICODERM CQ - DOSED IN MG/24 HOURS) 21 mg/24hr patch Place 21 mg onto the skin daily. (receiving through [facility])    . nicotine polacrilex (NICOTINE MINI) 2 MG lozenge Take 2 mg by mouth as  needed for smoking cessation. (receiving through [facility])    . omeprazole (PRILOSEC) 20 MG capsule Take 1 capsule (20 mg total) by mouth daily. 30 capsule 4  . Spacer/Aero-Holding Chambers (AEROCHAMBER PLUS) inhaler Use as instructed 1 each 2  . spironolactone (ALDACTONE) 50 MG tablet TK 1 T PO QD  4   Social History   Socioeconomic History  . Marital status: Single    Spouse name: Not on file  . Number of children: 1  . Years of education: college  . Highest education level: Not on file  Occupational History  . Occupation: Retired  Tobacco Use  . Smoking status: Current Every Day Smoker    Packs/day: 1.50    Years: 45.00    Pack years: 67.50    Types: Cigarettes    Start date: 52  . Smokeless tobacco: Never Used  Vaping Use  . Vaping Use: Never used  Substance and Sexual Activity  . Alcohol use: No    Alcohol/week: 0.0 standard drinks  . Drug use: No  . Sexual activity: Never  Other Topics Concern  . Not on file  Social History Narrative   Drinks 2 cups of coffee a day    Social Determinants of Health   Financial Resource Strain:   . Difficulty of Paying Living Expenses: Not on file  Food Insecurity:   . Worried About 12 in the Last Year: Not on file  . Ran Out  of Food in the Last Year: Not on file  Transportation Needs:   . Lack of Transportation (Medical): Not on file  . Lack of Transportation (Non-Medical): Not on file  Physical Activity:   . Days of Exercise per Week: Not on file  . Minutes of Exercise per Session: Not on file  Stress:   . Feeling of Stress : Not on file  Social Connections:   . Frequency of Communication with Friends and Family: Not on file  . Frequency of Social Gatherings with Friends and Family: Not on file  . Attends Religious Services: Not on file  . Active Member of Clubs or Organizations: Not on file  . Attends Banker Meetings: Not on file  . Marital Status: Not on file  Intimate Partner  Violence:   . Fear of Current or Ex-Partner: Not on file  . Emotionally Abused: Not on file  . Physically Abused: Not on file  . Sexually Abused: Not on file   Family History  Problem Relation Age of Onset  . Diabetes Mother   . Congestive Heart Failure Mother   . Kidney cancer Father   . Diabetes Father     OBJECTIVE: Vitals:   01/22/20 1407 01/22/20 1409  BP:  123/80  Pulse:  61  Resp:  19  Temp:  99 F (37.2 C)  TempSrc:  Oral  SpO2:  100%  Weight: 225 lb (102.1 kg)   Height: 5\' 4"  (1.626 m)     General appearance: alert; no distress Head: NCAT Lungs: clear to auscultation bilaterally Heart: regular rate and rhythm.  Radial pulse 2+ bilaterally Extremities: no edema Skin: warm and dry; vesicular rash to upper chest, thoracic back, shoulders, evidence of scratching with excoriation marks, no evidence of infection, no drainage Psychological: alert and cooperative; normal mood and affect  ASSESSMENT & PLAN:  1. Rash and nonspecific skin eruption   2. Contact dermatitis due to plants, except food, unspecified contact dermatitis type     Meds ordered this encounter  Medications  . predniSONE (STERAPRED UNI-PAK 21 TAB) 10 MG (21) TBPK tablet    Sig: Take by mouth daily for 6 days. Take 6 tablets on day 1, 5 tablets on day 2, 4 tablets on day 3, 3 tablets on day 4, 2 tablets on day 5, 1 tablet on day 6    Dispense:  21 tablet    Refill:  0    Order Specific Question:   Supervising Provider    Answer:   Merrilee Jansky    Prescribe prednisone taper Discussed with patient how to take this medication Take Zyrtec daily Benadryl at night for nighttime relief Take as prescribed and to completion Avoid hot showers/ baths Moisturize skin daily  Follow up with PCP if symptoms persists Return or go to the ER if you have any new or worsening symptoms such as fever, chills, nausea, vomiting, redness, swelling, discharge, if symptoms do not improve with  medications  Reviewed expectations re: course of current medical issues. Questions answered. Outlined signs and symptoms indicating need for more acute intervention. Patient verbalized understanding. After Visit Summary given.   [[id number]], NP 01/23/20 1015

## 2020-01-22 NOTE — ED Triage Notes (Signed)
Rash to upper chest and upper back x 1 week. Rash is itchy and burning in the beginning but the burning is gone now.

## 2020-01-22 NOTE — Discharge Instructions (Signed)
You have contact dermatitis. This is a rash that appears when your skin is exposed to an irritant.   I have sent in a prednisone taper for you to take for 6 days. 6 tablets on day one, 5 tablets on day two, 4 tablets on day three, 3 tablets on day four, 2 tablets on day five, and 1 tablet on day six.   Follow up with your primary care provider, or to see [place] as needed.  Report to the emergency room for shortness of breath, high fever, severe diarrhea, or other concerning symptoms.

## 2020-03-01 ENCOUNTER — Ambulatory Visit: Payer: Medicare Other | Attending: Internal Medicine

## 2020-03-01 DIAGNOSIS — Z23 Encounter for immunization: Secondary | ICD-10-CM

## 2020-03-01 NOTE — Progress Notes (Signed)
   Covid-19 Vaccination Clinic  Name:  JAKAYLAH SCHLAFER    MRN: [id number] DOB: 1956/04/08  03/01/2020  Ms. Licklider was observed post Covid-19 immunization for 15 minutes without incident. She was provided with Vaccine Information Sheet and instruction to access the V-Safe system.   Ms. Reber was instructed to call 911 with any severe reactions post vaccine: Marland Kitchen Difficulty breathing  . Swelling of face and throat  . A fast heartbeat  . A bad rash all over body  . Dizziness and weakness

## 2020-03-03 ENCOUNTER — Inpatient Hospital Stay: Admission: RE | Admit: 2020-03-03 | Payer: Medicare Other | Source: Ambulatory Visit

## 2020-03-16 ENCOUNTER — Ambulatory Visit (INDEPENDENT_AMBULATORY_CARE_PROVIDER_SITE_OTHER)
Admission: RE | Admit: 2020-03-16 | Discharge: 2020-03-16 | Disposition: A | Discharge status: Home / Self Care | Payer: Medicare Other | Source: Ambulatory Visit | Attending: Acute Care | Admitting: Acute Care

## 2020-03-16 ENCOUNTER — Telehealth: Payer: Self-pay

## 2020-03-16 ENCOUNTER — Other Ambulatory Visit: Payer: Self-pay

## 2020-03-16 DIAGNOSIS — F17210 Nicotine dependence, cigarettes, uncomplicated: Secondary | ICD-10-CM | POA: Diagnosis not present

## 2020-03-16 DIAGNOSIS — Z87891 Personal history of nicotine dependence: Secondary | ICD-10-CM | POA: Diagnosis not present

## 2020-03-16 DIAGNOSIS — Z122 Encounter for screening for malignant neoplasm of respiratory organs: Secondary | ICD-10-CM

## 2020-03-16 NOTE — Telephone Encounter (Signed)
Patient returning Lisa's call. Transferred to CT department.

## 2020-03-17 NOTE — Progress Notes (Signed)
Please call patient and let them  know their  low dose Ct was read as a Lung RADS 2: nodules that are benign in appearance and behavior with a very low likelihood of becoming a clinically active cancer due to size or lack of growth. Recommendation per radiology is for a repeat LDCT in 12 months. .Please let them  know we will order and schedule their  annual screening scan for 03/2021. Please let them  know there was notation of CAD on their  scan.  Please remind the patient  that this is a non-gated exam therefore degree or severity of disease  cannot be determined. Please have them  follow up with their PCP regarding potential risk factor modification, dietary therapy or pharmacologic therapy if clinically indicated. Pt.  is not currently on statin therapy. Please place order for annual  screening scan for  03/2021 and fax results to PCP. Thanks so much. 

## 2020-03-21 ENCOUNTER — Other Ambulatory Visit: Payer: Self-pay | Admitting: *Deleted

## 2020-03-21 DIAGNOSIS — F17210 Nicotine dependence, cigarettes, uncomplicated: Secondary | ICD-10-CM

## 2020-03-21 DIAGNOSIS — Z87891 Personal history of nicotine dependence: Secondary | ICD-10-CM

## 2020-06-15 ENCOUNTER — Other Ambulatory Visit: Payer: Self-pay

## 2020-06-15 ENCOUNTER — Other Ambulatory Visit: Payer: Medicare Other

## 2020-06-15 DIAGNOSIS — Z20822 Contact with and (suspected) exposure to covid-19: Secondary | ICD-10-CM

## 2020-06-17 LAB — SARS-COV-2, NAA 2 DAY TAT

## 2020-06-17 LAB — NOVEL CORONAVIRUS, NAA: SARS-CoV-2, NAA: NOT DETECTED

## 2020-10-04 ENCOUNTER — Ambulatory Visit: Payer: Medicare Other

## 2020-11-01 ENCOUNTER — Other Ambulatory Visit: Payer: Self-pay

## 2020-11-01 ENCOUNTER — Ambulatory Visit: Payer: Medicare Other | Attending: Internal Medicine

## 2020-11-01 DIAGNOSIS — Z23 Encounter for immunization: Secondary | ICD-10-CM

## 2020-11-01 MED ORDER — PFIZER-BIONT COVID-19 VAC-TRIS 30 MCG/0.3ML IM SUSP
INTRAMUSCULAR | 0 refills | Status: AC
  Filled 2020-11-01: qty 0.3, 1d supply, fill #0

## 2020-11-01 NOTE — Progress Notes (Signed)
   Covid-19 Vaccination Clinic  Name:  Judy Nguyen    MRN: [id number] DOB: [birth year]  11/01/2020  Judy Nguyen was observed post Covid-19 immunization for 15 minutes without incident. She was provided with Vaccine Information Sheet and instruction to access the V-Safe system.   Judy Nguyen was instructed to call 911 with any severe reactions post vaccine: Marland Kitchen Difficulty breathing  . Swelling of face and throat  . A fast heartbeat  . A bad rash all over body  . Dizziness and weakness   Immunizations Administered    Name Date Dose VIS Date Route   PFIZER Comrnaty(Gray TOP) Covid-19 Vaccine 11/01/2020  1:19 PM 0.3 mL 05/12/2020 Intramuscular   Manufacturer: [employer], [employer]   Lot: [id number]   NDC: [phone]

## 2021-02-01 ENCOUNTER — Other Ambulatory Visit: Payer: Self-pay

## 2021-02-01 ENCOUNTER — Ambulatory Visit
Admission: EM | Admit: 2021-02-01 | Discharge: 2021-02-01 | Disposition: A | Discharge status: Home / Self Care | Payer: Medicare Other | Attending: Sports Medicine | Admitting: Sports Medicine

## 2021-02-01 DIAGNOSIS — R0781 Pleurodynia: Secondary | ICD-10-CM

## 2021-02-01 DIAGNOSIS — M62830 Muscle spasm of back: Secondary | ICD-10-CM

## 2021-02-01 DIAGNOSIS — S29019A Strain of muscle and tendon of unspecified wall of thorax, initial encounter: Secondary | ICD-10-CM

## 2021-02-01 DIAGNOSIS — M940 Chondrocostal junction syndrome [Tietze]: Secondary | ICD-10-CM

## 2021-02-01 MED ORDER — ETODOLAC 400 MG PO TABS: 400.0000 mg | ORAL_TABLET | Freq: Two times a day (BID) | ORAL | 0 refills | Status: AC

## 2021-02-01 MED ORDER — METHOCARBAMOL 500 MG PO TABS: 500.0000 mg | ORAL_TABLET | Freq: Two times a day (BID) | ORAL | 0 refills | Status: DC

## 2021-02-01 NOTE — ED Provider Notes (Signed)
[facility]    CSN: [id number] Arrival date & time: 02/01/21  1405      History   Chief Complaint Chief Complaint  Patient presents with   Back Pain    HPI Judy Nguyen is a 65 y.o. female.   65 year old female who presents for evaluation of thoracic back pain and right-sided rib pain.  She has had her symptoms now for about a week.  She reports on 01/27/2021 she was pulling her garage door manually down and felt a pulling sensation in that area.  Since then she has had discomfort that is worse when she rises from a seated position or she is getting out of bed.  When she gets moving her symptoms do improve.  She denies any urinary or abdominal symptoms.  No numbness or tingling.  No radicular symptoms.  No neck pain.  No chest pain or shortness of breath.  Gets her primary care needs met in [place].  She is retired and is on disability.  No other issues or problems are offered.   Past Medical History:  Diagnosis Date   Asthma    Chronic back pain    Depression    Hypertension     Patient Active Problem List   Diagnosis Date Noted   GERD (gastroesophageal reflux disease) 05/22/2019   Centrilobular emphysema ([place]) 05/22/2019   Healthcare maintenance 05/22/2019   Dyspnea 02/04/2019   Post-nasal drip 02/04/2019   Tobacco abuse 02/04/2019    Past Surgical History:  Procedure Laterality Date   ABDOMINAL HYSTERECTOMY     WRIST FRACTURE SURGERY Right     OB History   No obstetric history on file.      Home Medications    Prior to Admission medications   Medication Sig Start Date End Date Taking? Authorizing Provider  albuterol (PROAIR HFA) 108 (90 Base) MCG/ACT inhaler Inhale 2 puffs into the lungs every 6 (six) hours as needed for wheezing or shortness of breath. 03/06/18  Yes Cook, Jayce G, DO  etodolac (LODINE) 400 MG tablet Take 1 tablet (400 mg total) by mouth 2 (two) times daily. 02/01/21  Yes Verda Cumins, MD  methocarbamol (ROBAXIN) 500 MG  tablet Take 1 tablet (500 mg total) by mouth 2 (two) times daily. 02/01/21  Yes Verda Cumins, MD  rosuvastatin (CRESTOR) 10 MG tablet Take 10 mg by mouth daily. 12/15/20  Yes [provider]  spironolactone (ALDACTONE) 50 MG tablet TK 1 T PO QD 11/29/15  Yes [provider]  buPROPion (WELLBUTRIN XL) 150 MG 24 hr tablet Take 1 tablet (150 mg total) by mouth daily. For THREE DAYS. Then increase to 2 tablets (336m total) by mouth daily. 06/08/19 07/08/19  MLauraine Rinne NP  COVID-19 mRNA Vac-TriS, Pfizer, (PFIZER-BIONT COVID-19 VAC-TRIS) SUSP injection Inject into the muscle. 11/01/20   SCarlyle Basques MD  nicotine (NICODERM CQ - DOSED IN MG/24 HOURS) 21 mg/24hr patch Place 21 mg onto the skin daily. (receiving through [facility])    [provider]  nicotine polacrilex (COMMIT) 2 MG lozenge Take 2 mg by mouth as needed for smoking cessation. (receiving through [facility])    [provider]  omeprazole (PRILOSEC) 20 MG capsule Take 1 capsule (20 mg total) by mouth daily. 05/22/19   MLauraine Rinne NP  Spacer/Aero-Holding Chambers (AEROCHAMBER PLUS) inhaler Use as instructed 07/05/18   MMelynda Ripple MD    Family History Family History  Problem Relation Age of Onset   Diabetes Mother    Congestive  Heart Failure Mother    Kidney cancer Father    Diabetes Father     Social History Social History   Tobacco Use   Smoking status: Every Day    Packs/day: 1.50    Years: 45.00    Pack years: 67.50    Types: Cigarettes    Start date: 1975   Smokeless tobacco: Never  Vaping Use   Vaping Use: Never used  Substance Use Topics   Alcohol use: No    Alcohol/week: 0.0 standard drinks   Drug use: No     Allergies   Sulfa drugs cross reactors   Review of Systems Review of Systems  Constitutional:  Positive for activity change. Negative for appetite change, chills, diaphoresis, fatigue and fever.  HENT:  Negative for congestion, ear pain, postnasal drip,  rhinorrhea, sinus pressure, sinus pain, sneezing and sore throat.   Eyes:  Negative for pain.  Respiratory:  Negative for cough, chest tightness and shortness of breath.   Cardiovascular:  Negative for chest pain and palpitations.  Gastrointestinal:  Negative for abdominal pain, diarrhea, nausea and vomiting.  Genitourinary:  Negative for dysuria.  Musculoskeletal:  Positive for arthralgias and back pain. Negative for myalgias, neck pain and neck stiffness.  Skin:  Negative for color change, pallor, rash and wound.  Neurological:  Negative for dizziness, syncope, light-headedness, numbness and headaches.  All other systems reviewed and are negative.   Physical Exam Triage Vital Signs ED Triage Vitals  Enc Vitals Group     BP 02/01/21 1452 (!) 128/46     Pulse Rate 02/01/21 1452 72     Resp 02/01/21 1452 18     Temp 02/01/21 1452 98.3 F (36.8 C)     Temp Source 02/01/21 1452 Oral     SpO2 02/01/21 1452 97 %     Weight 02/01/21 1448 223 lb (101.2 kg)     Height 02/01/21 1448 $RemoveBefor'5\' 4"'NnLZfbVFpGbd$  (1.626 m)     Head Circumference --      Peak Flow --      Pain Score 02/01/21 1448 6     Pain Loc --      Pain Edu? --      Excl. in [place]? --    No data found.  Updated Vital Signs BP (!) 128/46 (BP Location: Right Arm)   Pulse 72   Temp 98.3 F (36.8 C) (Oral)   Resp 18   Ht $R'5\' 4"'PN$  (1.626 m)   Wt 101.2 kg   SpO2 97%   BMI 38.28 kg/m   Visual Acuity Right Eye Distance:   Left Eye Distance:   Bilateral Distance:    Right Eye Near:   Left Eye Near:    Bilateral Near:     Physical Exam Vitals and nursing note reviewed.  Constitutional:      General: She is not in acute distress.    Appearance: Normal appearance. She is not ill-appearing, toxic-appearing or diaphoretic.  HENT:     Head: Normocephalic and atraumatic.     Nose: Nose normal.     Mouth/Throat:     Mouth: Mucous membranes are moist.  Eyes:     Conjunctiva/sclera: Conjunctivae normal.     Pupils: Pupils are equal,  round, and reactive to light.  Cardiovascular:     Rate and Rhythm: Normal rate and regular rhythm.     Pulses: Normal pulses.     Heart sounds: Normal heart sounds. No murmur heard.   No friction rub. No gallop.  Pulmonary:     Effort: Pulmonary effort is normal.     Breath sounds: Normal breath sounds. No stridor. No wheezing, rhonchi or rales.     Comments: Some tenderness over the lateral ribs midclavicular line and a little posterior.  This is on the right side around the level of 7 through 10. Chest:     Chest wall: Tenderness present.  Musculoskeletal:     Cervical back: Normal range of motion and neck supple. No rigidity or tenderness.     Thoracic back: Spasms, tenderness and bony tenderness present. No swelling. No scoliosis.  Lymphadenopathy:     Cervical: No cervical adenopathy.  Skin:    General: Skin is warm and dry.     Capillary Refill: Capillary refill takes less than 2 seconds.  Neurological:     General: No focal deficit present.     Mental Status: She is alert and oriented to person, place, and time.     UC Treatments / Results  Labs (all labs ordered are listed, but only abnormal results are displayed) Labs Reviewed - No data to display  EKG   Radiology No results found.  Procedures Procedures (including critical care time)  Medications Ordered in UC Medications - No data to display  Initial Impression / Assessment and Plan / UC Course  I have reviewed the triage vital signs and the nursing notes.  Pertinent labs & imaging results that were available during my care of the patient were reviewed by me and considered in my medical decision making (see chart for details).  Clinical impression: 1.  Acute thoracic myofascial strain 2.  Muscle spasm of the thoracic spine 3.  Rib pain on the right side 4.  Potential and mild acute right-sided costochondritis.  Treatment plan: 1.  The findings and treatment plan were discussed in detail with the  patient.  Patient was in agreement. 2.  Recommended prescription strength anti-inflammatory.  She assured me that she did not have any problems with her liver or spleen or any issues with a bleeding ulcer.  I did prescribe Lodine.  Warned her not to take any other anti-inflammatories.  No Motrin, Advil, ibuprofen, Naprosyn, Aleve or aspirin products.  She can take Tylenol only if she needs something in addition to this. 3.  Also prescribed a muscle relaxer, sent in Robaxin to her pharmacy. 4.  Educational handouts provided. 5.  Supportive care and activity as tolerated. 6.  If symptoms persist advised her to see her PCP. 7.  If symptoms were to worsen then she should go to the ER. 8.  She was stable upon discharge and will follow-up here as needed.    Final Clinical Impressions(s) / UC Diagnoses   Final diagnoses:  Acute thoracic myofascial strain, initial encounter  Muscle spasm of back  Rib pain on right side  Acute costochondritis     Discharge Instructions      As we discussed, it appears as though you have a muscle strain with spasm.  I am going to go ahead and give you a anti-inflammatory and sent it to your pharmacy.  No Motrin, Advil, ibuprofen, Naprosyn, Aleve, or aspirin products.  No other anti-inflammatories.  You can take Tylenol if you need anything extra. I also prescribed you a muscle relaxer. Please see educational handouts. Activity as tolerated with supportive care. If symptoms persist please follow-up with your primary care provider. If symptoms worsen then please go to the ER as you may need a higher level of  care with advanced imaging.     ED Prescriptions     Medication Sig Dispense Auth. Provider   methocarbamol (ROBAXIN) 500 MG tablet Take 1 tablet (500 mg total) by mouth 2 (two) times daily. 20 tablet Verda Cumins, MD   etodolac (LODINE) 400 MG tablet Take 1 tablet (400 mg total) by mouth 2 (two) times daily. 30 tablet Verda Cumins, MD       PDMP not reviewed this encounter.   Verda Cumins, MD 02/01/21 [phone]

## 2021-02-01 NOTE — Discharge Instructions (Addendum)
As we discussed, it appears as though you have a muscle strain with spasm.  I am going to go ahead and give you a anti-inflammatory and sent it to your pharmacy.  No Motrin, Advil, ibuprofen, Naprosyn, Aleve, or aspirin products.  No other anti-inflammatories.  You can take Tylenol if you need anything extra. I also prescribed you a muscle relaxer. Please see educational handouts. Activity as tolerated with supportive care. If symptoms persist please follow-up with your primary care provider. If symptoms worsen then please go to the ER as you may need a higher level of care with advanced imaging.

## 2021-02-01 NOTE — ED Triage Notes (Signed)
Patient states that she has pain in mid back and right rib area. States that pain started on Friday after manually lifting her garage door. States that area is more painful when getting up from laying down.

## 2021-03-14 ENCOUNTER — Other Ambulatory Visit: Payer: Self-pay

## 2021-03-14 ENCOUNTER — Ambulatory Visit: Payer: Medicare Other | Attending: Internal Medicine

## 2021-03-14 DIAGNOSIS — Z23 Encounter for immunization: Secondary | ICD-10-CM

## 2021-03-14 MED ORDER — PFIZER COVID-19 VAC BIVALENT 30 MCG/0.3ML IM SUSP
INTRAMUSCULAR | 0 refills | Status: DC
  Filled 2021-03-14: qty 0.3, 1d supply, fill #0

## 2021-03-14 NOTE — Progress Notes (Signed)
   Covid-19 Vaccination [facility]  Name:  ROZLYNN LIPPOLD    MRN: [id number] DOB: 12-13-1955  03/14/2021  Ms. Neglia was observed post Covid-19 immunization for 15 minutes without incident. She was provided with Vaccine Information Sheet and instruction to access the V-Safe system.   Ms. Campo was instructed to call 911 with any severe reactions post vaccine: Difficulty breathing  Swelling of face and throat  A fast heartbeat  A bad rash all over body  Dizziness and weakness   Drusilla Kanner, PharmD, MBA Clinical Acute Care Pharmacist

## 2021-04-17 ENCOUNTER — Other Ambulatory Visit: Payer: Self-pay

## 2021-04-17 DIAGNOSIS — Z87891 Personal history of nicotine dependence: Secondary | ICD-10-CM

## 2021-04-17 DIAGNOSIS — F17210 Nicotine dependence, cigarettes, uncomplicated: Secondary | ICD-10-CM

## 2021-04-24 ENCOUNTER — Ambulatory Visit (INDEPENDENT_AMBULATORY_CARE_PROVIDER_SITE_OTHER)
Admission: RE | Admit: 2021-04-24 | Discharge: 2021-04-24 | Disposition: A | Discharge status: Home / Self Care | Payer: Medicare Other | Source: Ambulatory Visit | Attending: Cardiology | Admitting: Cardiology

## 2021-04-24 ENCOUNTER — Other Ambulatory Visit: Payer: Self-pay

## 2021-04-24 DIAGNOSIS — Z87891 Personal history of nicotine dependence: Secondary | ICD-10-CM | POA: Diagnosis not present

## 2021-04-24 DIAGNOSIS — F17210 Nicotine dependence, cigarettes, uncomplicated: Secondary | ICD-10-CM

## 2021-05-02 ENCOUNTER — Other Ambulatory Visit: Payer: Self-pay | Admitting: Acute Care

## 2021-05-02 DIAGNOSIS — F17210 Nicotine dependence, cigarettes, uncomplicated: Secondary | ICD-10-CM

## 2021-05-02 DIAGNOSIS — Z87891 Personal history of nicotine dependence: Secondary | ICD-10-CM

## 2021-08-11 ENCOUNTER — Other Ambulatory Visit: Payer: Self-pay

## 2021-08-11 ENCOUNTER — Ambulatory Visit: Payer: Medicare Other | Admitting: Cardiology

## 2021-08-11 ENCOUNTER — Encounter: Payer: Self-pay | Admitting: Cardiology

## 2021-08-11 VITALS — BP 131/51 | HR 87 | Temp 98.4°F | Resp 17 | Ht 64.0 in | Wt 199.0 lb

## 2021-08-11 DIAGNOSIS — I2584 Coronary atherosclerosis due to calcified coronary lesion: Secondary | ICD-10-CM

## 2021-08-11 DIAGNOSIS — I2510 Atherosclerotic heart disease of native coronary artery without angina pectoris: Secondary | ICD-10-CM

## 2021-08-11 DIAGNOSIS — F17210 Nicotine dependence, cigarettes, uncomplicated: Secondary | ICD-10-CM

## 2021-08-11 DIAGNOSIS — E6609 Other obesity due to excess calories: Secondary | ICD-10-CM

## 2021-08-11 DIAGNOSIS — E66811 Other obesity due to excess calories: Secondary | ICD-10-CM

## 2021-08-11 DIAGNOSIS — E782 Mixed hyperlipidemia: Secondary | ICD-10-CM

## 2021-08-11 DIAGNOSIS — R7303 Prediabetes: Secondary | ICD-10-CM

## 2021-08-11 DIAGNOSIS — I10 Essential (primary) hypertension: Secondary | ICD-10-CM

## 2021-08-11 DIAGNOSIS — R55 Syncope and collapse: Secondary | ICD-10-CM

## 2021-08-11 DIAGNOSIS — I700 Atherosclerosis of aorta: Secondary | ICD-10-CM

## 2021-08-11 MED ORDER — ASPIRIN EC 81 MG PO TBEC: 81.0000 mg | DELAYED_RELEASE_TABLET | Freq: Every day | ORAL | 11 refills | Status: AC

## 2021-08-11 NOTE — Progress Notes (Signed)
Date:  08/11/2021   ID:  Judy Nguyen, DOB 1955-11-28, MRN [id number]  PCP:  Deland Pretty, MD  Cardiologist:  Rex Kras, DO, [facility] (established care 08/11/2021)  REASON FOR CONSULT: Syncope and CAD  REQUESTING PHYSICIAN:  Deland Pretty, MD [address] [place],  [place] [address]  Chief Complaint  Patient presents with   New Patient (Initial Visit)   Loss of Consciousness   Coronary Artery Disease   multi CV risk factors    HPI  Judy Nguyen is a 66 y.o. Native [place] / African-American female who presents to the office with a chief complaint of " loss of consciousness and coronary artery calcification." Patient's past medical history and cardiovascular risk factors include: Hypertension, prediabetes, hyperlipidemia, coronary artery calcification, aortic atherosclerosis, obesity due to excess calories, cigarette smoker.  She is referred to the office at the request of Deland Pretty, MD for evaluation of syncope and coronary artery calcification.  Patient recently had a positive Cologuard test and was recommended to undergo colonoscopy.  She tried to colon preps and finally was able to undergo colonoscopy but shortly thereafter was feeling constipated and started taking stool softeners.  She may have taken too much stool softeners as she was experiencing diarrhea shortly thereafter.  During one of her episodes she felt warm, flushed, lightheaded/dizziness, as she was ambulating to the bathroom she had a sensation of wanting to sit down.  In the next and she remembers she had lost consciousness and she was waking up on the floor.  Shortly thereafter when she regained consciousness and had some food the symptoms had resolved and since then no reoccurrence.  Unfortunately, she smokes 1-1.5 packs/day.  And recently had a lung cancer screening.  On a nongated CT study she is noted to have three-vessel coronary artery calcification and also aortic atherosclerosis.  She is  referred to the practice for additional evaluation and management.  FUNCTIONAL STATUS: No structured exercise program or daily routine.   ALLERGIES: Allergies  Allergen Reactions   Sulfa Drugs Cross Reactors Swelling    MEDICATION LIST PRIOR TO VISIT: Current Meds  Medication Sig   albuterol (PROAIR HFA) 108 (90 Base) MCG/ACT inhaler Inhale 2 puffs into the lungs every 6 (six) hours as needed for wheezing or shortness of breath.   aspirin EC 81 MG tablet Take 1 tablet (81 mg total) by mouth daily. Swallow whole.   methocarbamol (ROBAXIN) 500 MG tablet Take 1 tablet (500 mg total) by mouth 2 (two) times daily.   rosuvastatin (CRESTOR) 10 MG tablet Take 10 mg by mouth daily.   Semaglutide,0.25 or 0.5MG /DOS, (OZEMPIC, 0.25 OR 0.5 MG/DOSE,) 2 MG/1.5ML SOPN Inject 0.5 mg into the skin once a week.   spironolactone (ALDACTONE) 50 MG tablet TK 1 T PO QD     PAST MEDICAL HISTORY: Past Medical History:  Diagnosis Date   Aortic atherosclerosis (HCC)    Asthma    Chronic back pain    Coronary artery calcification    Depression    Hyperlipidemia    Hypertension    Prediabetes     PAST SURGICAL HISTORY: Past Surgical History:  Procedure Laterality Date   ABDOMINAL HYSTERECTOMY     WRIST FRACTURE SURGERY Right     FAMILY HISTORY: The patient family history includes Congestive Heart Failure in her mother; Diabetes in her father and mother; Kidney cancer in her father.  SOCIAL HISTORY:  The patient  reports that she has been smoking cigarettes. She started smoking  about 48 years ago. She has a 67.50 pack-year smoking history. She has never used smokeless tobacco. She reports current drug use. Drugs:  and Marijuana. She reports that she does not drink alcohol.  REVIEW OF SYSTEMS: Review of Systems  Cardiovascular:  Positive for dyspnea on exertion. Negative for chest pain, claudication, leg swelling, near-syncope, orthopnea, palpitations, paroxysmal nocturnal dyspnea and syncope.   Respiratory:  Negative for shortness of breath.    PHYSICAL EXAM: Vitals with BMI 08/11/2021 02/01/2021 01/22/2020  Height 5\' 4"  5\' 4"  5\' 4"   Weight 199 lbs 223 lbs 225 lbs  BMI 34.14 0000000 123456  Systolic [id number] 0000000 [id number]  Diastolic 51 46 80  Pulse 87 72 61   Orthostatic VS for the past 72 hrs (Last 3 readings):  Orthostatic BP Patient Position BP Location Cuff Size Orthostatic Pulse  08/11/21 1251 125/53 Sitting Left Arm Large 79  08/11/21 1249 126/55 Sitting Left Arm Large 86  08/11/21 1248 130/56 Supine Left Arm Large 88   \ CONSTITUTIONAL: Well-developed and well-nourished. No acute distress.  SKIN: Skin is warm and dry. No rash noted. No cyanosis. No pallor. No jaundice HEAD: Normocephalic and atraumatic.  EYES: No scleral icterus MOUTH/THROAT: Moist oral membranes.  NECK: No JVD present. No thyromegaly noted. No carotid bruits  LYMPHATIC: No visible cervical adenopathy.  CHEST Normal respiratory effort. No intercostal retractions  LUNGS: Clear to auscultation bilaterally.  No stridor. No wheezes. No rales.  CARDIOVASCULAR: Regular rate and rhythm, positive S1-S2, no murmurs rubs or gallops appreciated. ABDOMINAL: Soft, nontender, nondistended, positive bowel sounds in all 4 quadrants no apparent ascites.  EXTREMITIES: No peripheral edema, warm to touch, bilateral 2+ DP and PT pulses HEMATOLOGIC: No significant bruising NEUROLOGIC: Oriented to person, place, and time. Nonfocal. Normal muscle tone.  PSYCHIATRIC: Normal mood and affect. Normal behavior. Cooperative  RADIOLOGY: CT Chest lung cancer screening 04/24/2021: 1. Lung-RADS 2, benign appearance or behavior. Continue annual screening with low-dose chest CT without contrast in 12 months. 2. Mild three-vessel coronary artery calcifications. 3. Aortic Atherosclerosis (ICD10-I70.0) and Emphysema (ICD10-J43.9).  CARDIAC DATABASE: EKG: 08/11/2021: NSR, 87 bpm, normal axis, without underlying ischemia injury pattern.    Echocardiogram: No results found for this or any previous visit from the past 1095 days.    Stress Testing: No results found for this or any previous visit from the past 1095 days.   Heart Catheterization: None  LABORATORY DATA:  External Labs: Collected: 12/12/2020 provided by referring physician. Hemoglobin A1c 6.2. Total cholesterol 177, triglycerides 72, HDL 41, LDL 122, non-HDL 136. Hemoglobin 14.7 g/dL, hematocrit 43.5%. BUN 15, creatinine 0.91 mg/dL. AST 22, ALT 33, alkaline phosphatase 62  External Labs: Collected: 03/29/2021: Total cholesterol 118, triglycerides 67, HDL 39, LDL 66, non-HDL 79   IMPRESSION:    ICD-10-CM   1. Vasovagal syncope  R55     2. Coronary artery calcification  I25.10 EKG 12-Lead   I25.84 aspirin EC 81 MG tablet    PCV ECHOCARDIOGRAM COMPLETE    PCV MYOCARDIAL PERFUSION WO LEXISCAN    CT CARDIAC SCORING ([facility] LOCATIONS ONLY)    3. Aortic atherosclerosis (HCC)  I70.0 aspirin EC 81 MG tablet    PCV ECHOCARDIOGRAM COMPLETE    PCV MYOCARDIAL PERFUSION WO LEXISCAN    CT CARDIAC SCORING ([facility] LOCATIONS ONLY)    4. Prediabetes  R73.03     5. Cigarette smoker  F17.210     6. Benign hypertension  I10     7. Mixed hyperlipidemia  E78.2  8. Class 1 obesity due to excess calories with serious comorbidity and body mass index (BMI) of 34.0 to 34.9 in adult  E66.09    Z68.34        RECOMMENDATIONS: Judy Nguyen is a 66 y.o. Native [place] / African-American female whose past medical history and cardiac risk factors include: Hypertension, prediabetes, hyperlipidemia, coronary artery calcification, aortic atherosclerosis, obesity due to excess calories, cigarette smoker.  Vasovagal syncope Her recent episode of syncope was likely due to vasovagal etiology precipitated by dehydration given the recent colonoscopy prep, excessive stool softener use leading to diarrhea.  Both may have contributed to intravascular depletion which resulted in  her loss of consciousness.  Since then she has not had any reoccurrence of symptoms.  Given the infrequent episodes extended Holter monitor would be of low yield.  If she has any episodes of palpitations or reoccurrence of loss of consciousness we will reconsider ordering mobile cardiac amatory telemetry.  Otherwise, monitor for now.  Coronary artery calcification / Aortic atherosclerosis (HCC) Noted on nongated CT study. Denies angina pectoris. EKG: Normal sinus rhythm without underlying injury pattern. Shared decision was to proceed with a dedicated coronary calcium score for further risk stratification. Echo will be ordered to evaluate for structural heart disease and left ventricular systolic function. Exercise treadmill stress test to evaluate for functional status and reversible ischemia. Continue statin therapy. Start aspirin 81 mg p.o. daily Further recommendations to follow  Prediabetes Educated on the importance of glycemic control given other chronic comorbid conditions.  Cigarette smoker Tobacco cessation counseling: Currently smoking 1-1 0.5 packs/day   Patient was informed of the dangers of tobacco abuse including stroke, cancer, and MI, as well as benefits of tobacco cessation. Patient is not willing to quit at this time. 5 mins were spent counseling patient cessation techniques. We discussed various methods to help quit smoking, including deciding on a date to quit, joining a support group, pharmacological agents- nicotine gum/patch/lozenges.  I will reassess her progress at the next follow-up visit  Benign hypertension Office blood pressures within acceptable range. Medications reconciled. Currently managed by primary care provider.  Mixed hyperlipidemia Currently on rosuvastatin.   She denies myalgia or other side effects. Most recent lipids dated 03/2021 reviewed as noted above. Currently managed by primary care provider.  Class 1 obesity due to excess calories  with serious comorbidity and body mass index (BMI) of 34.0 to 34.9 in adult Body mass index is 34.16 kg/m. I reviewed with the patient the importance of diet, regular physical activity/exercise, weight loss.   Patient is educated on increasing physical activity gradually as tolerated.  With the goal of moderate intensity exercise for 30 minutes a day 5 days a week.    FINAL MEDICATION LIST END OF ENCOUNTER: Meds ordered this encounter  Medications   aspirin EC 81 MG tablet    Sig: Take 1 tablet (81 mg total) by mouth daily. Swallow whole.    Dispense:  30 tablet    Refill:  11    Medications Discontinued During This Encounter  Medication Reason   omeprazole (PRILOSEC) 20 MG capsule Patient Preference     Current Outpatient Medications:    albuterol (PROAIR HFA) 108 (90 Base) MCG/ACT inhaler, Inhale 2 puffs into the lungs every 6 (six) hours as needed for wheezing or shortness of breath., Disp: 18 g, Rfl: 1   aspirin EC 81 MG tablet, Take 1 tablet (81 mg total) by mouth daily. Swallow whole., Disp: 30 tablet, Rfl: 11  methocarbamol (ROBAXIN) 500 MG tablet, Take 1 tablet (500 mg total) by mouth 2 (two) times daily., Disp: 20 tablet, Rfl: 0   rosuvastatin (CRESTOR) 10 MG tablet, Take 10 mg by mouth daily., Disp: , Rfl:    Semaglutide,0.25 or 0.5MG /DOS, (OZEMPIC, 0.25 OR 0.5 MG/DOSE,) 2 MG/1.5ML SOPN, Inject 0.5 mg into the skin once a week., Disp: , Rfl:    spironolactone (ALDACTONE) 50 MG tablet, TK 1 T PO QD, Disp: , Rfl: 4   buPROPion (WELLBUTRIN XL) 150 MG 24 hr tablet, Take 1 tablet (150 mg total) by mouth daily. For THREE DAYS. Then increase to 2 tablets (300mg  total) by mouth daily., Disp: 60 tablet, Rfl: 0   COVID-19 mRNA bivalent vaccine, Pfizer, (PFIZER COVID-19 VAC BIVALENT) injection, Inject into the muscle., Disp: 0.3 mL, Rfl: 0   COVID-19 mRNA Vac-TriS, Pfizer, (PFIZER-BIONT COVID-19 VAC-TRIS) SUSP injection, Inject into the muscle., Disp: 0.3 mL, Rfl: 0   etodolac (LODINE)  400 MG tablet, Take 1 tablet (400 mg total) by mouth 2 (two) times daily., Disp: 30 tablet, Rfl: 0   nicotine (NICODERM CQ - DOSED IN MG/24 HOURS) 21 mg/24hr patch, Place 21 mg onto the skin daily. (receiving through [facility]), Disp: , Rfl:    nicotine polacrilex (COMMIT) 2 MG lozenge, Take 2 mg by mouth as needed for smoking cessation. (receiving through [facility]), Disp: , Rfl:    Spacer/Aero-Holding Chambers (AEROCHAMBER PLUS) inhaler, Use as instructed, Disp: 1 each, Rfl: 2  Orders Placed This Encounter  Procedures   CT CARDIAC SCORING ([facility] LOCATIONS ONLY)   PCV MYOCARDIAL PERFUSION WO LEXISCAN   EKG 12-Lead   PCV ECHOCARDIOGRAM COMPLETE    There are no Patient Instructions on file for this visit.   --Continue cardiac medications as reconciled in final medication list. --Return in about 6 weeks (around 09/22/2021) for Follow up, Coronary artery calcification, Review test results. Or sooner if needed. --Continue follow-up with your primary care physician regarding the management of your other chronic comorbid conditions.  Patient's questions and concerns were addressed to her satisfaction. She voices understanding of the instructions provided during this encounter.   This note was created using a voice recognition software as a result there may be grammatical errors inadvertently enclosed that do not reflect the nature of this encounter. Every attempt is made to correct such errors.  Rex Kras, [place], [facility]  Pager: [phone] Office: [phone]

## 2021-08-14 ENCOUNTER — Other Ambulatory Visit: Payer: Self-pay

## 2021-08-14 ENCOUNTER — Ambulatory Visit: Payer: Medicare Other

## 2021-08-14 DIAGNOSIS — I2510 Atherosclerotic heart disease of native coronary artery without angina pectoris: Secondary | ICD-10-CM

## 2021-08-14 DIAGNOSIS — I700 Atherosclerosis of aorta: Secondary | ICD-10-CM

## 2021-09-06 ENCOUNTER — Other Ambulatory Visit: Payer: Medicare Other

## 2021-09-08 ENCOUNTER — Other Ambulatory Visit: Payer: Medicare Other

## 2021-09-20 ENCOUNTER — Other Ambulatory Visit: Payer: Medicare Other

## 2021-09-26 ENCOUNTER — Ambulatory Visit: Payer: Medicare Other

## 2021-09-26 ENCOUNTER — Ambulatory Visit: Payer: Medicare Other | Admitting: Cardiology

## 2021-10-04 ENCOUNTER — Inpatient Hospital Stay: Admission: RE | Admit: 2021-10-04 | Payer: Medicare Other | Source: Ambulatory Visit

## 2021-11-07 ENCOUNTER — Other Ambulatory Visit: Payer: Medicare Other

## 2021-11-08 ENCOUNTER — Other Ambulatory Visit: Payer: Medicare Other

## 2021-11-15 ENCOUNTER — Encounter (HOSPITAL_BASED_OUTPATIENT_CLINIC_OR_DEPARTMENT_OTHER): Payer: Self-pay | Admitting: Emergency Medicine

## 2021-11-15 ENCOUNTER — Other Ambulatory Visit: Payer: Self-pay

## 2021-11-15 ENCOUNTER — Emergency Department (HOSPITAL_BASED_OUTPATIENT_CLINIC_OR_DEPARTMENT_OTHER)
Admission: EM | Admit: 2021-11-15 | Discharge: 2021-11-15 | Disposition: A | Discharge status: Home / Self Care | Payer: Medicare Other | Attending: Emergency Medicine | Admitting: Emergency Medicine

## 2021-11-15 DIAGNOSIS — Z7982 Long term (current) use of aspirin: Secondary | ICD-10-CM | POA: Diagnosis not present

## 2021-11-15 DIAGNOSIS — M5440 Lumbago with sciatica, unspecified side: Secondary | ICD-10-CM | POA: Diagnosis not present

## 2021-11-15 DIAGNOSIS — M545 Low back pain, unspecified: Secondary | ICD-10-CM | POA: Diagnosis present

## 2021-11-15 DIAGNOSIS — Z794 Long term (current) use of insulin: Secondary | ICD-10-CM | POA: Insufficient documentation

## 2021-11-15 DIAGNOSIS — F0390 Unspecified dementia without behavioral disturbance: Secondary | ICD-10-CM | POA: Diagnosis not present

## 2021-11-15 MED ORDER — OXYCODONE-ACETAMINOPHEN 5-325 MG PO TABS
2.0000 | ORAL_TABLET | Freq: Once | ORAL | Status: AC
  Administered 2021-11-15: 2 via ORAL
  Filled 2021-11-15: qty 2

## 2021-11-15 MED ORDER — OXYCODONE HCL 5 MG PO TABS: 5.0000 mg | ORAL_TABLET | Freq: Four times a day (QID) | ORAL | 0 refills | Status: DC | PRN

## 2021-11-15 NOTE — ED Provider Notes (Signed)
[facility] EMERGENCY DEPT Provider Note   CSN: [id number] Arrival date & time: 11/15/21  1229     History  Chief Complaint  Patient presents with   Back Pain    Judy Nguyen is a 66 y.o. female w/ hx of spinal stenosis here with back pain.  She has been exerting herself to care for her elder father who has dementia, and began having worsening back pain last week, radiating down both legs.  Went to UC and was given meloxicam and steroid injection and robaxin, which helped "a little" but not enough for her to continue helping her father.  She goes to [employer], has an appointment at the ortho clinic at the end of the month.  No bladder incontinence, falls, or trauma.  HPI     Home Medications Prior to Admission medications   Medication Sig Start Date End Date Taking? Authorizing Provider  albuterol (PROAIR HFA) 108 (90 Base) MCG/ACT inhaler Inhale 2 puffs into the lungs every 6 (six) hours as needed for wheezing or shortness of breath. 03/06/18   Tommie Sams, DO  aspirin EC 81 MG tablet Take 1 tablet (81 mg total) by mouth daily. Swallow whole. 08/11/21   Tolia, Sunit, DO  buPROPion (WELLBUTRIN XL) 150 MG 24 hr tablet Take 1 tablet (150 mg total) by mouth daily. For THREE DAYS. Then increase to 2 tablets (300mg  total) by mouth daily. 06/08/19 07/08/19  09/05/19, NP  COVID-19 mRNA bivalent vaccine, Pfizer, (PFIZER COVID-19 VAC BIVALENT) injection Inject into the muscle. 03/14/21   05/14/21, MD  COVID-19 mRNA Vac-TriS, Pfizer, (PFIZER-BIONT COVID-19 VAC-TRIS) SUSP injection Inject into the muscle. 11/01/20   11/03/20, MD  etodolac (LODINE) 400 MG tablet Take 1 tablet (400 mg total) by mouth 2 (two) times daily. 02/01/21   02/03/21, MD  methocarbamol (ROBAXIN) 500 MG tablet Take 1 tablet (500 mg total) by mouth 2 (two) times daily. 02/01/21   02/03/21, MD  nicotine (NICODERM CQ - DOSED IN MG/24 HOURS) 21 mg/24hr patch Place 21 mg onto the skin  daily. (receiving through [facility])    [provider]  nicotine polacrilex (COMMIT) 2 MG lozenge Take 2 mg by mouth as needed for smoking cessation. (receiving through [facility])    [provider]  rosuvastatin (CRESTOR) 10 MG tablet Take 10 mg by mouth daily. 12/15/20   [provider]  Semaglutide,0.25 or 0.5MG /DOS, (OZEMPIC, 0.25 OR 0.5 MG/DOSE,) 2 MG/1.5ML SOPN Inject 0.5 mg into the skin once a week.    [provider]  Spacer/Aero-Holding Chambers (AEROCHAMBER PLUS) inhaler Use as instructed 07/05/18   09/03/18, MD  spironolactone (ALDACTONE) 50 MG tablet TK 1 T PO QD 11/29/15   [provider]      Allergies    Sulfa drugs cross reactors    Review of Systems   Review of Systems  Physical Exam Updated Vital Signs BP (!) 141/64 (BP Location: Right Arm)   Pulse 66   Temp 97.8 F (36.6 C)   Resp 20   Ht 5\' 4"  (1.626 m)   Wt 83.9 kg   SpO2 100%   BMI 31.76 kg/m  Physical Exam Constitutional:      General: She is not in acute distress. HENT:     Head: Normocephalic and atraumatic.  Eyes:     Conjunctiva/sclera: Conjunctivae normal.     Pupils: Pupils are equal, round, and reactive to light.  Cardiovascular:     Rate and  Rhythm: Normal rate and regular rhythm.     Pulses: Normal pulses.  Pulmonary:     Effort: Pulmonary effort is normal. No respiratory distress.  Abdominal:     General: There is no distension.     Tenderness: There is no abdominal tenderness.  Skin:    General: Skin is warm and dry.  Neurological:     General: No focal deficit present.     Mental Status: She is alert and oriented to person, place, and time. Mental status is at baseline.     Sensory: No sensory deficit.     Motor: No weakness.  Psychiatric:        Mood and Affect: Mood normal.        Behavior: Behavior normal.     ED Results / Procedures / Treatments   Labs (all labs ordered are listed, but only abnormal results are  displayed) Labs Reviewed - No data to display  EKG None  Radiology No results found.  Procedures Procedures    Medications Ordered in ED Medications  oxyCODONE-acetaminophen (PERCOCET/ROXICET) 5-325 MG per tablet 2 tablet (has no administration in time range)    ED Course/ Medical Decision Making/ A&P                           Medical Decision Making Risk Prescription drug management.   Back pain - suspect related to spinal stenosis. No red flags for cauda equina syndrome No traumatic mechanism to suspect fx - don't think radiography imaging is indicated at this point.  Will provide short term narcotic prescription for breakthrough pain, as she needs to be able to care for her father.  She can continue her other meds for now, and f/u with ortho this month.  She understands that narcotics are a limited and short-term solution for a long-term problem, and the ER will not provide long-term narcotic prescriptions.         Final Clinical Impression(s) / ED Diagnoses Final diagnoses:  Acute bilateral low back pain with sciatica, sciatica laterality unspecified    Rx / DC Orders ED Discharge Orders     None         Terald Sleeper, MD 11/15/21 1630

## 2021-11-15 NOTE — ED Notes (Signed)
RN provided AVS using Teachback Method. Patient verbalizes understanding of Discharge Instructions. Opportunity for Questioning and Answers were provided by RN. Patient Discharged from ED in Wheelchair to Home with family.  

## 2021-11-15 NOTE — ED Triage Notes (Signed)
C/o back pain. Hx of spinal stenosis and arthritis. Went to ortho UC this week and given steroids and muscle relaxer. Reports pain is ongoing.

## 2021-12-21 ENCOUNTER — Other Ambulatory Visit: Payer: Self-pay | Admitting: Internal Medicine

## 2021-12-21 DIAGNOSIS — Z1231 Encounter for screening mammogram for malignant neoplasm of breast: Secondary | ICD-10-CM

## 2022-04-24 ENCOUNTER — Ambulatory Visit (HOSPITAL_COMMUNITY): Admission: RE | Admit: 2022-04-24 | Payer: Medicare Other | Source: Ambulatory Visit

## 2022-04-25 ENCOUNTER — Telehealth: Payer: Self-pay

## 2022-04-25 NOTE — Telephone Encounter (Signed)
Left VM for patient to call to reschedule LDCT

## 2022-06-25 ENCOUNTER — Ambulatory Visit: Payer: Medicare Other

## 2022-06-25 DIAGNOSIS — I2510 Atherosclerotic heart disease of native coronary artery without angina pectoris: Secondary | ICD-10-CM

## 2022-06-25 DIAGNOSIS — I700 Atherosclerosis of aorta: Secondary | ICD-10-CM

## 2022-06-29 NOTE — Progress Notes (Signed)
LMTCB

## 2022-07-02 NOTE — Progress Notes (Signed)
Pt aware.

## 2022-07-05 ENCOUNTER — Ambulatory Visit: Payer: Medicare Other | Admitting: Internal Medicine

## 2022-07-05 ENCOUNTER — Encounter: Payer: Self-pay | Admitting: Internal Medicine

## 2022-07-05 VITALS — BP 128/64 | HR 78 | Ht 64.0 in | Wt 167.6 lb

## 2022-07-05 DIAGNOSIS — R55 Syncope and collapse: Secondary | ICD-10-CM

## 2022-07-05 DIAGNOSIS — I10 Essential (primary) hypertension: Secondary | ICD-10-CM

## 2022-07-05 DIAGNOSIS — I2510 Atherosclerotic heart disease of native coronary artery without angina pectoris: Secondary | ICD-10-CM

## 2022-07-05 NOTE — Progress Notes (Signed)
Date:  07/05/2022   ID:  Judy Nguyen, DOB [birth year], MRN [id number]  PCP:  Deland Pretty, MD  Cardiologist:  Rex Kras, DO, [facility] (established care 08/11/2021)  REASON FOR CONSULT: Syncope and CAD  REQUESTING PHYSICIAN:  Deland Pretty, MD [address] [place],  [place] [address]  Chief Complaint  Patient presents with   Coronary artery calcification   Follow-up   Results    HPI  Judy Nguyen is a 67 y.o. Native [place] / African-American female who presents to the office with a chief complaint of " loss of consciousness and coronary artery calcification." Patient's past medical history and cardiovascular risk factors include: Hypertension, prediabetes, hyperlipidemia, coronary artery calcification, aortic atherosclerosis, obesity due to excess calories, cigarette smoker.  She is referred to the office at the request of Deland Pretty, MD for evaluation of syncope and coronary artery calcification.  Patient is here for a follow-up visit. She has been doing well since the last time she was here. She is tolerating her medications without issues. Patient denies chest pain, shortness of breath, palpitations, diaphoresis, syncope, edema, PND, orthopnea.   FUNCTIONAL STATUS: No structured exercise program or daily routine.   ALLERGIES: Allergies  Allergen Reactions   Other Anxiety, Other (See Comments), Nausea And Vomiting, Nausea Only, Shortness Of Breath and Swelling    And headaches   Sulfa Drugs Cross Reactors Swelling   Sulfamethoxazole-Trimethoprim Hives    MEDICATION LIST PRIOR TO VISIT: Current Meds  Medication Sig   albuterol (PROAIR HFA) 108 (90 Base) MCG/ACT inhaler Inhale 2 puffs into the lungs every 6 (six) hours as needed for wheezing or shortness of breath.   aspirin EC 81 MG tablet Take 1 tablet (81 mg total) by mouth daily. Swallow whole.   diclofenac (VOLTAREN) 75 MG EC tablet Take 75 mg by mouth 2 (two) times daily as needed.   gabapentin  (NEURONTIN) 300 MG capsule Take 300 mg by mouth 2 (two) times daily.   rosuvastatin (CRESTOR) 10 MG tablet Take 10 mg by mouth daily.   Spacer/Aero-Holding Chambers (AEROCHAMBER PLUS) inhaler Use as instructed   spironolactone (ALDACTONE) 50 MG tablet TK 1 T PO QD   tiZANidine (ZANAFLEX) 2 MG tablet Take 2 mg by mouth every 6 (six) hours as needed.     PAST MEDICAL HISTORY: Past Medical History:  Diagnosis Date   Aortic atherosclerosis (HCC)    Asthma    Chronic back pain    Coronary artery calcification    Depression    Hyperlipidemia    Hypertension    Prediabetes     PAST SURGICAL HISTORY: Past Surgical History:  Procedure Laterality Date   ABDOMINAL HYSTERECTOMY     WRIST FRACTURE SURGERY Right     FAMILY HISTORY: The patient family history includes Congestive Heart Failure in her mother; Diabetes in her father and mother; Kidney cancer in her father.  SOCIAL HISTORY:  The patient  reports that she has been smoking cigarettes. She started smoking about 49 years ago. She has a 67.50 pack-year smoking history. She has never used smokeless tobacco. She reports current drug use. Drugs:  and Marijuana. She reports that she does not drink alcohol.  REVIEW OF SYSTEMS: Review of Systems  Cardiovascular:  Positive for dyspnea on exertion. Negative for chest pain, claudication, leg swelling, near-syncope, orthopnea, palpitations, paroxysmal nocturnal dyspnea and syncope.  Respiratory:  Negative for shortness of breath.     PHYSICAL EXAM:    07/05/2022   11:19 AM 11/15/2021  3:39 PM 11/15/2021    1:31 PM  Vitals with BMI  Height 5\' 4"     Weight 167 lbs 10 oz    BMI 13.08    Systolic 657 846 962  Diastolic 64 64 71  Pulse 78 66 64   Orthostatic VS for the past 72 hrs (Last 3 readings):  Patient Position BP Location Cuff Size  07/05/22 1119 Sitting Left Arm Normal   \ CONSTITUTIONAL: Well-developed and well-nourished. No acute distress.  SKIN: Skin is warm and dry. No  rash noted. No cyanosis. No pallor. No jaundice HEAD: Normocephalic and atraumatic.  EYES: No scleral icterus MOUTH/THROAT: Moist oral membranes.  NECK: No JVD present. No thyromegaly noted. No carotid bruits  LYMPHATIC: No visible cervical adenopathy.  CHEST Normal respiratory effort. No intercostal retractions  LUNGS: Clear to auscultation bilaterally.  No stridor. No wheezes. No rales.  CARDIOVASCULAR: Regular rate and rhythm, positive S1-S2, no murmurs rubs or gallops appreciated. ABDOMINAL: Soft, nontender, nondistended, positive bowel sounds in all 4 quadrants no apparent ascites.  EXTREMITIES: No peripheral edema, warm to touch, bilateral 2+ DP and PT pulses HEMATOLOGIC: No significant bruising NEUROLOGIC: Oriented to person, place, and time. Nonfocal. Normal muscle tone.  PSYCHIATRIC: Normal mood and affect. Normal behavior. Cooperative  RADIOLOGY: CT Chest lung cancer screening 04/24/2021: 1. Lung-RADS 2, benign appearance or behavior. Continue annual screening with low-dose chest CT without contrast in 12 months. 2. Mild three-vessel coronary artery calcifications. 3. Aortic Atherosclerosis (ICD10-I70.0) and Emphysema (ICD10-J43.9).  CARDIAC DATABASE: EKG: 08/11/2021: NSR, 87 bpm, normal axis, without underlying ischemia injury pattern.   Echocardiogram: Echocardiogram 08/14/2021:  Left ventricle cavity is normal in size and wall thickness. Normal global  wall motion. Normal LV systolic function with EF 59%. Normal diastolic  filling pattern.  Mild tricuspid regurgitation.  Unable to calculate RV systolic pressure due to inadequate TR jet.  Normal right atrial pressure     Stress Testing: Regadenoson (with Mod Bruce protocol) Nuclear stress test 06/25/2022 Myocardial perfusion is normal. Breast tissue attenuation present. Overall LV systolic function is normal without regional wall motion abnormalities. Stress LV EF: 67%.  Low risk study. Nondiagnostic ECG stress. The  heart rate response was consistent with Regadenoson. The blood pressure response was normal. No previous exam available for comparison.   Heart Catheterization: None  LABORATORY DATA:  External Labs: Collected: 12/12/2020 provided by referring physician. Hemoglobin A1c 6.2. Total cholesterol 177, triglycerides 72, HDL 41, LDL 122, non-HDL 136. Hemoglobin 14.7 g/dL, hematocrit 43.5%. BUN 15, creatinine 0.91 mg/dL. AST 22, ALT 33, alkaline phosphatase 62  External Labs: Collected: 03/29/2021: Total cholesterol 118, triglycerides 67, HDL 39, LDL 66, non-HDL 79   IMPRESSION:    ICD-10-CM   1. Coronary artery calcification  I25.10 EKG 12-Lead   I25.84     2. Essential hypertension  I10     3. Vasovagal syncope  R55        RECOMMENDATIONS: Judy Nguyen is a 67 y.o. Native [place] / African-American female whose past medical history and cardiac risk factors include: Hypertension, prediabetes, hyperlipidemia, coronary artery calcification, aortic atherosclerosis, obesity due to excess calories, cigarette smoker.  Vasovagal syncope No further episodes  Coronary artery calcification / Aortic atherosclerosis (HCC) Stress test negative for ischemia Continue statin therapy. Start aspirin 81 mg p.o. daily   Benign hypertension Office blood pressures within acceptable range. Medications reconciled. Currently managed by primary care provider.    FINAL MEDICATION LIST END OF ENCOUNTER: No orders of the defined types were placed  in this encounter.   Medications Discontinued During This Encounter  Medication Reason   buPROPion (WELLBUTRIN XL) 150 MG 24 hr tablet Patient Preference   COVID-19 mRNA bivalent vaccine, Pfizer, (PFIZER COVID-19 VAC BIVALENT) injection Patient Preference   methocarbamol (ROBAXIN) 500 MG tablet Patient Preference   nicotine polacrilex (COMMIT) 2 MG lozenge Patient Preference   nicotine (NICODERM CQ - DOSED IN MG/24 HOURS) 21 mg/24hr patch Patient  Preference   oxyCODONE (ROXICODONE) 5 MG immediate release tablet Patient Preference   Semaglutide,0.25 or 0.5MG /DOS, (OZEMPIC, 0.25 OR 0.5 MG/DOSE,) 2 MG/1.5ML SOPN Patient Preference     Current Outpatient Medications:    albuterol (PROAIR HFA) 108 (90 Base) MCG/ACT inhaler, Inhale 2 puffs into the lungs every 6 (six) hours as needed for wheezing or shortness of breath., Disp: 18 g, Rfl: 1   aspirin EC 81 MG tablet, Take 1 tablet (81 mg total) by mouth daily. Swallow whole., Disp: 30 tablet, Rfl: 11   diclofenac (VOLTAREN) 75 MG EC tablet, Take 75 mg by mouth 2 (two) times daily as needed., Disp: , Rfl:    gabapentin (NEURONTIN) 300 MG capsule, Take 300 mg by mouth 2 (two) times daily., Disp: , Rfl:    rosuvastatin (CRESTOR) 10 MG tablet, Take 10 mg by mouth daily., Disp: , Rfl:    Spacer/Aero-Holding Chambers (AEROCHAMBER PLUS) inhaler, Use as instructed, Disp: 1 each, Rfl: 2   spironolactone (ALDACTONE) 50 MG tablet, TK 1 T PO QD, Disp: , Rfl: 4   tiZANidine (ZANAFLEX) 2 MG tablet, Take 2 mg by mouth every 6 (six) hours as needed., Disp: , Rfl:    COVID-19 mRNA Vac-TriS, Pfizer, (PFIZER-BIONT COVID-19 VAC-TRIS) SUSP injection, Inject into the muscle. (Patient not taking: Reported on 07/05/2022), Disp: 0.3 mL, Rfl: 0   etodolac (LODINE) 400 MG tablet, Take 1 tablet (400 mg total) by mouth 2 (two) times daily. (Patient not taking: Reported on 07/05/2022), Disp: 30 tablet, Rfl: 0  Orders Placed This Encounter  Procedures   EKG 12-Lead      Floydene Flock, DO, [facility]  Pager: [phone] Office: [phone]

## 2022-09-03 NOTE — Therapy (Signed)
OUTPATIENT PHYSICAL THERAPY THORACOLUMBAR EVALUATION   Patient Name: Judy Nguyen MRN: [id number] DOB:Oct 12, 1955, 67 y.o., female Today's Date: 09/05/2022  END OF SESSION:  PT End of Session - 09/04/22 1419     Visit Number 1    Number of Visits 12    Date for PT Re-Evaluation 10/16/22    Authorization Type [employer] MCR    PT Start Time 1320    PT Stop Time 1410    PT Time Calculation (min) 50 min    Activity Tolerance Patient tolerated treatment well    Behavior During Therapy WFL for tasks assessed/performed             Past Medical History:  Diagnosis Date   Aortic atherosclerosis    Asthma    Chronic back pain    Coronary artery calcification    Depression    Hyperlipidemia    Hypertension    Prediabetes    Past Surgical History:  Procedure Laterality Date   ABDOMINAL HYSTERECTOMY     WRIST FRACTURE SURGERY Right    Patient Active Problem List   Diagnosis Date Noted   Prediabetes 08/11/2021   Coronary artery calcification 08/11/2021   Aortic atherosclerosis 08/11/2021   GERD (gastroesophageal reflux disease) 05/22/2019   Centrilobular emphysema 05/22/2019   Healthcare maintenance 05/22/2019   Dyspnea 02/04/2019   Post-nasal drip 02/04/2019   Tobacco abuse 02/04/2019     REFERRING PROVIDER: Romero Belling, MD  REFERRING DIAG:  Lumbar stenosis; lumbago w/ radicular pain  Rationale for Evaluation and Treatment: Rehabilitation  THERAPY DIAG:  Other low back pain  Pain in left leg  Pain in right leg  Muscle weakness (generalized)  ONSET DATE: Chronic pain / Exacerbation in May 2023  SUBJECTIVE:                                                                                                                                                                                           SUBJECTIVE STATEMENT:  Pt has had lumbar/LE pain for 20 years with no specific injury.  Pt has had prior aquatic and land based in the past which has helped some.  Her  pain worsened in May 2023 when she was caring for sick father.  Pt had ESI's in August and October which did not provide relief.  Pt had MRI's in 2009 and 2010.  See objective for findings or see reports in Epic.  Pt went to [facility] in October and notes indicated Lumbar Spine X-rays: L4-5, L5-S1 with facet arthrosis and L4-5 spondylolisthesis and MRI with central, lateral recess and foraminal stenosis L3-S1.  MD recommended fusion (L3-S1 TLIF)  and Pt declined. Pt then had a steroid shot in January which did improve sx's.  Pt saw Dr. Maurice Small in February and he ordered PT.  PT order on 07/24/2022 indicated Aquatic and land based therapy.  Balance/Fall prevention.  TENS unit.      Her pain used to move from lumbar/hips to knees, but now travels further down LE's to ankles.  Her pain is more sharp now. Her pain travels from bilat sides of lower back to hips down LE's to ankles.   Pt is limited with and has increased pain with sitting, lifting objects, ambulation, and standing.  Pt has increased pain with household chores including vacuuming, mopping, and cooking.  She is limited with standing to shower.  Pt has pain ambulating in the grocery store.  She has pain with bed mobility.  Pt did a lot of cooking for the family at [employer] and had increased pain.            Pt walks in the pool for 1 hour 5x/week.  Pt states she feels better in the water.    PERTINENT HISTORY:  Per dx and L4-5 spondylolisthesis HTN  PAIN:  Are you having pain? Yes NPRS:  6/10 current, 20/10 worst, 5/10 best  Location:  Lumbar/post hips which travels to her ankles, bilat L > R.  She also has some pain in lower thoracic.  Type:  sharp, dull, achy, burning  PRECAUTIONS: Other: L4-5 spondylolisthesis  WEIGHT BEARING RESTRICTIONS: No  FALLS:  Has patient fallen in last 6 months? No  HOME ENVIRONMENT: Pt lives alone in a 1 story home.  Pt has 3 steps with a rail to enter home.   OCCUPATION: Pt is on disability  retirement.  PLOF: Independent.  Pt has a hx of chronic lumbar pain which has affected her mobility and performance of daily activities.   PATIENT GOALS: to decrease pain.  To be able to do what she can and keep her mobility.    OBJECTIVE:   DIAGNOSTIC FINDINGS:  MRI in 2009 showed L4-5, L5-S1 multifactorial central stenosis, L4-5 mod to severe L subarticular recess stenosis, and L5-S1 subarticular lateral recess stenosis L > R with associated foraminal narrowing.  MRI in 2010 showed no change in stenosis at those levels including mild to mod L3-4 central canal stenosis.  Also showed broad based disc bulge.  Notes from [facility]:  Spine X-rays: L4-5, L5-S1 with facet arthrosis and L4-5 spondylolisthesis and MRI with central, lateral recess and foraminal stenosis L3-S1  PATIENT SURVEYS:  Will give FOTO next visit    COGNITION: Overall cognitive status: Within functional limits for tasks assessed     SENSATION: Assess LT next visit  MUSCLE LENGTH: Pt has tightness in bilat HS  POSTURE: lateral shift, shoulders to Right    LUMBAR ROM:   AROM eval  Flexion WFL  Extension   Right lateral flexion 75% with pain  Left lateral flexion WFL  Right rotation 50% with pain  Left rotation 50%   (Blank rows = not tested)  LOWER EXTREMITY MMT:     Strength  Right eval Left eval  Hip flexion 5/5 5/5  Hip extension    Hip abduction    Hip adduction    Hip internal rotation    Hip external rotation 4-/5 4/5  Knee flexion 4-/5 seated 4-/5 seated  Knee extension 4+/5 4+/5  Ankle dorsiflexion    Ankle plantarflexion    Ankle inversion    Ankle eversion     (  Blank rows = not tested)      LUMBAR SPECIAL TESTS:  Supine SLR test:  R:  negative, L:  positive   GAIT: Assistive device utilized: None Level of assistance: Complete Independence Comments: Decreased step length, decreased gait speed, and increased L toe out.  TODAY'S TREATMENT:                                                                                                                                 PT educated pt with lateral shift correction in standing.  Pt states her back felt better.  She could feel a litte bit of pulling in her R thigh though no pain.  PT gave pt exercise for HEP and instructed pt she should not have increased pain with HEP.  PT instructed pt to stop exercise if she has increased pain and sx's down her LE or increased back pain.  PATIENT EDUCATION:  Education details: dx, HEP, POC, relevant anatomy, and objective findings.  PT answered pt's questions.  Person educated: Patient Education method: Explanation, Demonstration, Tactile cues, Verbal cues, and Handouts Education comprehension: verbalized understanding, returned demonstration, verbal cues required, tactile cues required, and needs further education  HOME EXERCISE PROGRAM: Lateral shift correct exercise in standing with hands on wall, 2x/day, 2-5 reps with 10 sec hold  ASSESSMENT:  CLINICAL IMPRESSION: Patient is a 67 y.o. female with a dx of lumbar stenosis and lumbago with radiculopathy presenting to the clinic with LBP, bilat LE pain, muscle weakness, and limited lumbar AROM.  Pt has a hx of chronic back pain with exacerbation in May last year when caring for her sick father.  She c/o's of pain from her lumbar down bilat LE's to ankles.  Pt is limited with and has increased pain with ADLs/IADLs and functional mobility skills including household chores, ambulation, bed mobility, and standing activities.  Pt has pain ambulating in the grocery store.  Pt has a lateral shift present with shoulders offset to the right.  Pt had a positive SLR test on L.  She should benefit from skilled PT services to address impairments and improve overall function.     OBJECTIVE IMPAIRMENTS: Abnormal gait, decreased activity tolerance, decreased endurance, decreased mobility, difficulty walking, decreased ROM, decreased strength,  hypomobility, impaired flexibility, postural dysfunction, and pain.   ACTIVITY LIMITATIONS: carrying, lifting, sitting, standing, bed mobility, and locomotion level  PARTICIPATION LIMITATIONS: meal prep, cleaning, shopping, and community activity  PERSONAL FACTORS: Time since onset of injury/illness/exacerbation are also affecting patient's functional outcome.   REHAB POTENTIAL: Good  CLINICAL DECISION MAKING: Stable/uncomplicated  EVALUATION COMPLEXITY: Low   GOALS:   SHORT TERM GOALS: Target date: 09/25/2022   Pt will be independent and compliant with HEP for improved pain, strength, ROM, and function.  Baseline: Goal status: INITIAL  2.  Pt will report at least a 25% improvement in pain and sx's overall for improved mobility.  Baseline:  Goal status: INITIAL  3.  Pt will be able to perform bed mobility without significant pain and difficulty  Baseline:  Goal status: INITIAL  4.  Pt will be able to cook without significant pain and difficulty.  Baseline:  Goal status: INITIAL Target date:  10/02/2022    LONG TERM GOALS: Target date: 10/16/2022   Pt will be able to stand and shower without increased pain.  Baseline:  Goal status: INITIAL  2.  Pt will be able to perform her normal ousehold chores without significant pain.   Baseline:  Goal status: INITIAL  3.  Pt will demo improved bilat Hip ER and knee flex and ext strength to 5/5 MMT for improved performance of and tolerance with functional mobility skills.  Baseline:  Goal status: INITIAL  4.  Pt will ambulate her normal community distance including shopping in the grocery store without significant pain.  Baseline:  Goal status: INITIAL    PLAN:  PT FREQUENCY: 2x/week  PT DURATION: 6 weeks  PLANNED INTERVENTIONS: Therapeutic exercises, Therapeutic activity, Neuromuscular re-education, Balance training, Gait training, Patient/Family education, Self Care, Joint mobilization, Stair training, Aquatic  Therapy, Dry Needling, Electrical stimulation, Cryotherapy, Moist heat, Taping, Ultrasound, Manual therapy, and Re-evaluation.  PLAN FOR NEXT SESSION: Review lateral shift correction exercise and assess response to exercise.  Assess LT sensation and give FOTO next visit.  Perform PPT and core strengthening.  Possibly nn flossing technique.    Audie Clear III PT, DPT 09/05/22 4:12 PM

## 2022-09-04 ENCOUNTER — Ambulatory Visit (HOSPITAL_BASED_OUTPATIENT_CLINIC_OR_DEPARTMENT_OTHER): Payer: Medicare Other | Attending: Physical Medicine and Rehabilitation | Admitting: Physical Therapy

## 2022-09-04 ENCOUNTER — Other Ambulatory Visit: Payer: Self-pay

## 2022-09-04 ENCOUNTER — Encounter (HOSPITAL_BASED_OUTPATIENT_CLINIC_OR_DEPARTMENT_OTHER): Payer: Self-pay | Admitting: Physical Therapy

## 2022-09-04 DIAGNOSIS — M6281 Muscle weakness (generalized): Secondary | ICD-10-CM | POA: Diagnosis present

## 2022-09-04 DIAGNOSIS — M79604 Pain in right leg: Secondary | ICD-10-CM

## 2022-09-04 DIAGNOSIS — M79605 Pain in left leg: Secondary | ICD-10-CM

## 2022-09-04 DIAGNOSIS — M5459 Other low back pain: Secondary | ICD-10-CM

## 2022-09-11 ENCOUNTER — Encounter (HOSPITAL_BASED_OUTPATIENT_CLINIC_OR_DEPARTMENT_OTHER): Payer: Self-pay | Admitting: Physical Therapy

## 2022-09-11 ENCOUNTER — Ambulatory Visit (HOSPITAL_BASED_OUTPATIENT_CLINIC_OR_DEPARTMENT_OTHER): Payer: Medicare Other | Admitting: Physical Therapy

## 2022-09-11 DIAGNOSIS — M79604 Pain in right leg: Secondary | ICD-10-CM

## 2022-09-11 DIAGNOSIS — M79605 Pain in left leg: Secondary | ICD-10-CM

## 2022-09-11 DIAGNOSIS — M5459 Other low back pain: Secondary | ICD-10-CM

## 2022-09-11 DIAGNOSIS — M6281 Muscle weakness (generalized): Secondary | ICD-10-CM

## 2022-09-11 NOTE — Therapy (Signed)
OUTPATIENT PHYSICAL THERAPY TREATMENT NOTE   Patient Name: Judy Nguyen MRN: [id number] DOB:1956/04/24, 67 y.o., female Today's Date: 09/11/2022  END OF SESSION:  PT End of Session - 09/11/22 1413     Visit Number 2    PT Start Time 1410             Past Medical History:  Diagnosis Date   Aortic atherosclerosis    Asthma    Chronic back pain    Coronary artery calcification    Depression    Hyperlipidemia    Hypertension    Prediabetes    Past Surgical History:  Procedure Laterality Date   ABDOMINAL HYSTERECTOMY     WRIST FRACTURE SURGERY Right    Patient Active Problem List   Diagnosis Date Noted   Prediabetes 08/11/2021   Coronary artery calcification 08/11/2021   Aortic atherosclerosis 08/11/2021   GERD (gastroesophageal reflux disease) 05/22/2019   Centrilobular emphysema 05/22/2019   Healthcare maintenance 05/22/2019   Dyspnea 02/04/2019   Post-nasal drip 02/04/2019   Tobacco abuse 02/04/2019     REFERRING PROVIDER: Romero Belling, MD  REFERRING DIAG:  Lumbar stenosis; lumbago w/ radicular pain  Rationale for Evaluation and Treatment: Rehabilitation  THERAPY DIAG:  No diagnosis found.  ONSET DATE: Chronic pain / Exacerbation in May 2023  SUBJECTIVE:                                                                                                                                                                                           SUBJECTIVE STATEMENT:  Pt has had lumbar/LE pain for 20 years with no specific injury.  Pt has had prior aquatic and land based in the past which has helped some.  Her pain worsened in May 2023 when she was caring for sick father.  Pt had ESI's in August and October which did not provide relief.  Pt had MRI's in 2009 and 2010.  See objective for findings or see reports in Epic.  Pt went to [facility] in October and notes indicated Lumbar Spine X-rays: L4-5, L5-S1 with facet arthrosis and L4-5 spondylolisthesis  and MRI with central, lateral recess and foraminal stenosis L3-S1.  MD recommended fusion (L3-S1 TLIF) and Pt declined. Pt then had a steroid shot in January which did improve sx's.  Pt saw Dr. Maurice Small in February and he ordered PT.  PT order on 07/24/2022 indicated Aquatic and land based therapy.  Balance/Fall prevention.  TENS unit.      Her pain used to move from lumbar/hips to knees, but now travels further down LE's to ankles.  Her pain is more sharp now.  Her pain travels from bilat sides of lower back to hips down LE's to ankles.   Pt is limited with and has increased pain with sitting, lifting objects, ambulation, and standing.  Pt performs her household chores  though has increased pain with those activities including vacuuming, mopping, and cooking.  She is limited with standing to shower.  Pt has pain ambulating in the grocery store.  She has pain with turning over in bed.     Pt denies any adverse effects after prior Rx.  Pt has been performing home exercise and can't tell a difference though has not made it worse.  Pt states "I'm still in a lot of pain."  Pt has pain which begins in her lumbar and travels down bilat LE's to her ankles.      PERTINENT HISTORY:  Per dx and L4-5 spondylolisthesis HTN  PAIN:  Are you having pain? Yes NPRS:  5/10 current, 20/10 worst, 5/10 best  Location:  Lumbar pain with tingling in bilat LE's  Type:  sharp, dull, achy, burning  PRECAUTIONS: Other: L4-5 spondylolisthesis  WEIGHT BEARING RESTRICTIONS: No  FALLS:  Has patient fallen in last 6 months? No  HOME ENVIRONMENT: Pt lives alone in a 1 story home.  Pt has 3 steps with a rail to enter home.   OCCUPATION: Pt is on disability retirement.  PLOF: Independent.  Pt has a hx of chronic lumbar pain which has affected her mobility and performance of daily activities.   PATIENT GOALS: to decrease pain.  To be able to do what she can and keep her mobility.    OBJECTIVE:   DIAGNOSTIC FINDINGS:   MRI in 2009 showed L4-5, L5-S1 multifactorial central stenosis, L4-5 mod to severe L subarticular recess stenosis, and L5-S1 subarticular lateral recess stenosis L > R with associated foraminal narrowing.  MRI in 2010 showed no change in stenosis at those levels including mild to mod L3-4 central canal stenosis.  Also showed broad based disc bulge.  Notes from [facility]:  Spine X-rays: L4-5, L5-S1 with facet arthrosis and L4-5 spondylolisthesis and MRI with central, lateral recess and foraminal stenosis L3-S1    TODAY'S TREATMENT:        PATIENT SURVEYS:  Will give FOTO next visit  SENSATION: L2 and L3:  R:  1+, L:  2+ L4, L5, S1:  R:  2+, L:  3+ S2:  R and L:  2+   MUSCLE LENGTH: Pt has tightness in bilat HS   POSTURE: lateral shift, shoulders to Right                                                                                                                             Pt performed lateral shift correction with hands on wall approx 4-5 reps x 10 sec hold.  PT educated pt with lateral shift correction in standing.  Pt states her back felt better.    Pt performed   PPT  2x10  Supine marching with PPT 2x10  Supine alt LE ext with PPT x6  Supine lumbar rotation 2x10 reps  Supine 90/90 active nn flossing x 10 bilat  Pt received supine manual HS stretch with passive AP's nn flossing    PT gave pt exercise for HEP and instructed pt she should not have increased pain with HEP.  PT instructed pt to stop exercise if she has increased pain and sx's down her LE or increased back pain.  PATIENT EDUCATION:  Education details: dx, HEP, POC, relevant anatomy, and objective findings.  PT answered pt's questions.  Person educated: Patient Education method: Explanation, Demonstration, Tactile cues, Verbal cues, and Handouts Education comprehension: verbalized understanding, returned demonstration, verbal cues required, tactile cues required, and needs further education  HOME  EXERCISE PROGRAM: Lateral shift correct exercise in standing with hands on wall, 2x/day, 2-5 reps with 10 sec hold  Updated HEP:  Access Code: [email or web address] URL: [email or web address] Date: 09/11/2022 Prepared by: Aaron Edelmanrey Lorynn Moeser  Exercises - Supine Posterior Pelvic Tilt  - 2 x daily - 7 x weekly - 2 sets - 10 reps - Supine March  - 1-2 x daily - 7 x weekly - 2 sets - 10 reps - Hooklying Lumbar Rotation  - 2 x daily - 7 x weekly - 2 sets - 10 reps  ASSESSMENT:  CLINICAL IMPRESSION: Patient is a 67 y.o. female with a dx of lumbar stenosis and lumbago with radiculopathy presenting to the clinic with LBP, bilat LE pain, muscle weakness, and limited lumbar AROM.  Pt has a hx of chronic back pain with exacerbation in May last year when caring for her sick father.  She c/o's of pain from her lumbar down bilat LE's to ankles.  Pt is limited with and has increased pain with ADLs/IADLs and functional mobility skills including household chores, ambulation, bed mobility, and standing activities.  Pt has pain ambulating in the grocery store.  Pt has a lateral shift present with shoulders offset to the right.  Pt had a positive SLR test on L.  She should benefit from skilled PT services to address impairments and improve overall function.    Pt demonstrates good form with standing lateral shift correction and is able to achieve good posture.  Pt states her back feels better with that exercise. Feels a little better, no worse more relaxed,   OBJECTIVE IMPAIRMENTS: Abnormal gait, decreased activity tolerance, decreased endurance, decreased mobility, difficulty walking, decreased ROM, decreased strength, hypomobility, impaired flexibility, postural dysfunction, and pain.   ACTIVITY LIMITATIONS: carrying, lifting, sitting, standing, bed mobility, and locomotion level  PARTICIPATION LIMITATIONS: meal prep, cleaning, shopping, and community activity  PERSONAL FACTORS: Time since onset of  injury/illness/exacerbation are also affecting patient's functional outcome.   REHAB POTENTIAL: Good  CLINICAL DECISION MAKING: Stable/uncomplicated  EVALUATION COMPLEXITY: Low   GOALS:   SHORT TERM GOALS: Target date: 09/25/2022   Pt will be independent and compliant with HEP for improved pain, strength, ROM, and function.  Baseline: Goal status: INITIAL  2.  Pt will report at least a 25% improvement in pain and sx's overall for improved mobility.  Baseline:  Goal status: INITIAL  3.  Pt will be able to perform bed mobility without significant pain and difficulty  Baseline:  Goal status: INITIAL  4.  Pt will be able to cook without significant pain and difficulty.  Baseline:  Goal status: INITIAL Target date:  10/02/2022    LONG TERM GOALS: Target date: 10/16/2022   Pt will  be able to stand and shower without increased pain.  Baseline:  Goal status: INITIAL  2.  Pt will be able to perform her normal ousehold chores without significant pain.   Baseline:  Goal status: INITIAL  3.  Pt will demo improved bilat Hip ER and knee flex and ext strength to 5/5 MMT for improved performance of and tolerance with functional mobility skills.  Baseline:  Goal status: INITIAL  4.  Pt will ambulate her normal community distance including shopping in the grocery store without significant pain.  Baseline:  Goal status: INITIAL    PLAN:  PT FREQUENCY: 2x/week  PT DURATION: 6 weeks  PLANNED INTERVENTIONS: Therapeutic exercises, Therapeutic activity, Neuromuscular re-education, Balance training, Gait training, Patient/Family education, Self Care, Joint mobilization, Stair training, Aquatic Therapy, Dry Needling, Electrical stimulation, Cryotherapy, Moist heat, Taping, Ultrasound, Manual therapy, and Re-evaluation.  PLAN FOR NEXT SESSION: Review lateral shift correction exercise and assess response to exercise.  Assess LT sensation and give FOTO next visit.  Perform PPT and core  strengthening.  Possibly nn flossing technique.    Audie Clear III PT, DPT 09/11/22 2:13 PM

## 2022-09-18 ENCOUNTER — Encounter (HOSPITAL_BASED_OUTPATIENT_CLINIC_OR_DEPARTMENT_OTHER): Payer: Self-pay | Admitting: Physical Therapy

## 2022-09-18 ENCOUNTER — Ambulatory Visit (HOSPITAL_BASED_OUTPATIENT_CLINIC_OR_DEPARTMENT_OTHER): Payer: Medicare Other | Admitting: Physical Therapy

## 2022-09-18 DIAGNOSIS — M79604 Pain in right leg: Secondary | ICD-10-CM

## 2022-09-18 DIAGNOSIS — M5459 Other low back pain: Secondary | ICD-10-CM

## 2022-09-18 DIAGNOSIS — M79605 Pain in left leg: Secondary | ICD-10-CM

## 2022-09-18 DIAGNOSIS — M6281 Muscle weakness (generalized): Secondary | ICD-10-CM

## 2022-09-18 NOTE — Therapy (Signed)
OUTPATIENT PHYSICAL THERAPY TREATMENT NOTE   Patient Name: Judy Nguyen MRN: [id number] DOB:06-10-1955, 67 y.o., female Today's Date: 09/18/2022  END OF SESSION:  PT End of Session - 09/18/22 1039     Visit Number 3    Number of Visits 12    Date for PT Re-Evaluation 10/16/22    Authorization Type [employer] MCR    PT Start Time 1037   pt arrived late;   PT Stop Time 1115    PT Time Calculation (min) 38 min    Behavior During Therapy WFL for tasks assessed/performed             Past Medical History:  Diagnosis Date   Aortic atherosclerosis    Asthma    Chronic back pain    Coronary artery calcification    Depression    Hyperlipidemia    Hypertension    Prediabetes    Past Surgical History:  Procedure Laterality Date   ABDOMINAL HYSTERECTOMY     WRIST FRACTURE SURGERY Right    Patient Active Problem List   Diagnosis Date Noted   Prediabetes 08/11/2021   Coronary artery calcification 08/11/2021   Aortic atherosclerosis 08/11/2021   GERD (gastroesophageal reflux disease) 05/22/2019   Centrilobular emphysema 05/22/2019   Healthcare maintenance 05/22/2019   Dyspnea 02/04/2019   Post-nasal drip 02/04/2019   Tobacco abuse 02/04/2019     REFERRING PROVIDER: Romero Belling, MD  REFERRING DIAG:  Lumbar stenosis; lumbago w/ radicular pain  Rationale for Evaluation and Treatment: Rehabilitation  THERAPY DIAG:  Other low back pain  Pain in left leg  Pain in right leg  Muscle weakness (generalized)  ONSET DATE: Chronic pain / Exacerbation in May 2023  SUBJECTIVE:                                                                                                                                                                                           SUBJECTIVE STATEMENT: Pt reports that she goes to [facility] and walks 5 days a wk in the pool for an hour.  She hasn't done aquatic therapy in over 10 yrs.  She reports she feels ok in water if she can hold  onto the floats.     PERTINENT HISTORY:  Per dx and L4-5 spondylolisthesis HTN  PAIN:  Are you having pain? Yes NPRS:  6-7/10 current,  Location:  Lumbar pain radiating into bilat LE's  Type:  sharp, dull, achy, burning  PRECAUTIONS: Other: L4-5 spondylolisthesis  WEIGHT BEARING RESTRICTIONS: No  FALLS:  Has patient fallen in last 6 months? No  HOME ENVIRONMENT: Pt lives alone in a 1 story  home.  Pt has 3 steps with a rail to enter home.   OCCUPATION: Pt is on disability retirement.  PLOF: Independent.  Pt has a hx of chronic lumbar pain which has affected her mobility and performance of daily activities.   PATIENT GOALS: to decrease pain.  To be able to do what she can and keep her mobility.    OBJECTIVE:   DIAGNOSTIC FINDINGS:  MRI in 2009 showed L4-5, L5-S1 multifactorial central stenosis, L4-5 mod to severe L subarticular recess stenosis, and L5-S1 subarticular lateral recess stenosis L > R with associated foraminal narrowing.  MRI in 2010 showed no change in stenosis at those levels including mild to mod L3-4 central canal stenosis.  Also showed broad based disc bulge.  Notes from [facility]:  Spine X-rays: L4-5, L5-S1 with facet arthrosis and L4-5 spondylolisthesis and MRI with central, lateral recess and foraminal stenosis L3-S1    TODAY'S TREATMENT:     Pt seen for aquatic therapy today.  Treatment took place in water 3.5-4.75 ft in depth at the [facility] pool. Temp of water was 91.  Pt entered/exited the pool via stairs independently with bilat rail. * holding rainbow hand floats:  walking forward and backward (feels best facing lap pool) and side stpping * Side stepping with arm addct with rainbow hand floats x 3 laps * single / double rainbow hand floats at side with backward/  * TrA set with tricep push downs with yellow hand floats x 10 * TrA set with short blue noodle pull down x 15; then with knee taps x 10  * walking forward with  reciprocal arm swing * seated on noodle like bike and balancing, with UE holding yellow hand floats  Pt requires the buoyancy and hydrostatic pressure of water for support, and to offload joints by unweighting joint load by at least 50 % in navel deep water and by at least 75-80% in chest to neck deep water.  Viscosity of the water is needed for resistance of strengthening. Water current perturbations provides challenge to standing balance requiring increased core activation.     PATIENT SURVEYS:  FOTO:  44 with a goal of 52 at visit 12  SENSATION: L2 and L3:  R:  1+, L:  2+ L4, L5, S1:  R:  2+, L:  3+ S2:  R and L:  2+   MUSCLE LENGTH: Pt has tightness in bilat HS   POSTURE: lateral shift, shoulders to Right                                                                                                                              Pt received a HEP handout and was instructed in correct form and appropriate frequency.  Pt instructed she should not have increased pain with HEP.    PATIENT EDUCATION:  Education details: re-aquainting to aquatic therapy  Person educated: Patient Education method: Explanation, Demonstration, Tactile cues, Verbal cues Education  comprehension: verbalized understanding, returned demonstration, verbal cues required, tactile cues required, and needs further education  HOME EXERCISE PROGRAM: Lateral shift correct exercise in standing with hands on wall, 2x/day, 2-5 reps with 10 sec hold  Updated HEP:  Access Code: [email or web address] URL: [email or web address] Date: 09/11/2022 Prepared by: Aaron Edelman  Exercises - Supine Posterior Pelvic Tilt  - 2 x daily - 7 x weekly - 2 sets - 10 reps - Supine March  - 1-2 x daily - 7 x weekly - 2 sets - 10 reps - Hooklying Lumbar Rotation  - 2 x daily - 7 x weekly - 2 sets - 10 reps  ASSESSMENT:  CLINICAL IMPRESSION: Since pt is currently walking at pool 5x/wk, discussed variations she can try to  include in her session.  She required minor cues for TrA engagment, but reported good tolerance for exercises today. She was able to walk without UE support in 4+ft of water with confidence.  Will try aqua jogger belt and hand floats in deeper water next visit.  She should benefit from continued skilled PT services to address goals and impairments and improve overall function.    OBJECTIVE IMPAIRMENTS: Abnormal gait, decreased activity tolerance, decreased endurance, decreased mobility, difficulty walking, decreased ROM, decreased strength, hypomobility, impaired flexibility, postural dysfunction, and pain.   ACTIVITY LIMITATIONS: carrying, lifting, sitting, standing, bed mobility, and locomotion level  PARTICIPATION LIMITATIONS: meal prep, cleaning, shopping, and community activity  PERSONAL FACTORS: Time since onset of injury/illness/exacerbation are also affecting patient's functional outcome.   REHAB POTENTIAL: Good  CLINICAL DECISION MAKING: Stable/uncomplicated  EVALUATION COMPLEXITY: Low   GOALS:   SHORT TERM GOALS: Target date: 09/25/2022   Pt will be independent and compliant with HEP for improved pain, strength, ROM, and function.  Baseline: Goal status: INITIAL  2.  Pt will report at least a 25% improvement in pain and sx's overall for improved mobility.  Baseline:  Goal status: INITIAL  3.  Pt will be able to perform bed mobility without significant pain and difficulty  Baseline:  Goal status: INITIAL  4.  Pt will be able to cook without significant pain and difficulty.  Baseline:  Goal status: INITIAL Target date:  10/02/2022    LONG TERM GOALS: Target date: 10/16/2022   Pt will be able to stand and shower without increased pain.  Baseline:  Goal status: INITIAL  2.  Pt will be able to perform her normal ousehold chores without significant pain.   Baseline:  Goal status: INITIAL  3.  Pt will demo improved bilat Hip ER and knee flex and ext strength to  5/5 MMT for improved performance of and tolerance with functional mobility skills.  Baseline:  Goal status: INITIAL  4.  Pt will ambulate her normal community distance including shopping in the grocery store without significant pain.  Baseline:  Goal status: INITIAL    PLAN:  PT FREQUENCY: 2x/week  PT DURATION: 6 weeks  PLANNED INTERVENTIONS: Therapeutic exercises, Therapeutic activity, Neuromuscular re-education, Balance training, Gait training, Patient/Family education, Self Care, Joint mobilization, Stair training, Aquatic Therapy, Dry Needling, Electrical stimulation, Cryotherapy, Moist heat, Taping, Ultrasound, Manual therapy, and Re-evaluation.  PLAN FOR NEXT SESSION: Review HEP.  Perform PPT, core strengthening,  Mayer Camel, PTA 09/18/22 1:32 PM [facility] [address] [place], [place], [address] Phone: [phone]   Fax:  [phone]

## 2022-09-24 ENCOUNTER — Ambulatory Visit (HOSPITAL_BASED_OUTPATIENT_CLINIC_OR_DEPARTMENT_OTHER): Payer: Medicare Other

## 2022-09-26 ENCOUNTER — Ambulatory Visit (HOSPITAL_BASED_OUTPATIENT_CLINIC_OR_DEPARTMENT_OTHER): Payer: Medicare Other | Admitting: Physical Therapy

## 2022-09-26 ENCOUNTER — Encounter (HOSPITAL_BASED_OUTPATIENT_CLINIC_OR_DEPARTMENT_OTHER): Payer: Self-pay | Admitting: Physical Therapy

## 2022-09-26 DIAGNOSIS — M79605 Pain in left leg: Secondary | ICD-10-CM

## 2022-09-26 DIAGNOSIS — M79604 Pain in right leg: Secondary | ICD-10-CM

## 2022-09-26 DIAGNOSIS — M5459 Other low back pain: Secondary | ICD-10-CM | POA: Diagnosis not present

## 2022-09-26 DIAGNOSIS — M6281 Muscle weakness (generalized): Secondary | ICD-10-CM

## 2022-09-26 NOTE — Therapy (Signed)
OUTPATIENT PHYSICAL THERAPY TREATMENT NOTE   Patient Name: Judy Nguyen MRN: [id number] DOB:[birth year], 67 y.o., female Today's Date: 09/27/2022  END OF SESSION:  PT End of Session - 09/26/22 1413       Visit Number 4     Number of Visits 12     Date for PT Re-Evaluation 10/16/22     Authorization Type [employer] MCR     PT Start Time 1439     PT Stop Time 1511     PT Time Calculation (min) 32 min     Activity Tolerance Patient tolerated treatment well     Behavior During Therapy WFL for tasks assessed/performed    Past Medical History:  Diagnosis Date   Aortic atherosclerosis (HCC)    Asthma    Chronic back pain    Coronary artery calcification    Depression    Hyperlipidemia    Hypertension    Prediabetes    Past Surgical History:  Procedure Laterality Date   ABDOMINAL HYSTERECTOMY     WRIST FRACTURE SURGERY Right    Patient Active Problem List   Diagnosis Date Noted   Prediabetes 08/11/2021   Coronary artery calcification 08/11/2021   Aortic atherosclerosis (HCC) 08/11/2021   GERD (gastroesophageal reflux disease) 05/22/2019   Centrilobular emphysema (HCC) 05/22/2019   Healthcare maintenance 05/22/2019   Dyspnea 02/04/2019   Post-nasal drip 02/04/2019   Tobacco abuse 02/04/2019     REFERRING PROVIDER: Romero Belling, MD  REFERRING DIAG:  Lumbar stenosis; lumbago w/ radicular pain  Rationale for Evaluation and Treatment: Rehabilitation  THERAPY DIAG:  Other low back pain  Pain in left leg  Pain in right leg  Muscle weakness (generalized)  ONSET DATE: Chronic pain / Exacerbation in May 2023  SUBJECTIVE:                                                                                                                                                                                           SUBJECTIVE STATEMENT:  Pt had been going to the pool to perform walking prior to aquatic therapy.  Pt denies any adverse effects after prior aquatic and prior land  Rx.  Pt went to the pool 5 days last week and performed walking and her exercises.  Pt's pain was getting worse in bilat LE's.  She was having much difficulty moving legs first thing in AM due to pain.  Pt saw Dr. Maurice Small yesterday and had a steroid and anti-inflammatory shot.  MD also prescribed tramadol and instructed pt to continue with PT.  Pt states she wants to try PT today.    She has pain with  turning over in bed.  Pt reports compliance with HEP and states the exercises help.    PERTINENT HISTORY:  Per dx and L4-5 spondylolisthesis HTN  PAIN:  Are you having pain? Yes NPRS:  5-6/10 current, 20/10 worst, 5/10 best  Location:  Lumbar and t/o entire bilat LE's.   Type:  sharp, dull, achy, burning.  She also has tingling in bilat LE's   PRECAUTIONS: Other: L4-5 spondylolisthesis  WEIGHT BEARING RESTRICTIONS: No  FALLS:  Has patient fallen in last 6 months? No  HOME ENVIRONMENT: Pt lives alone in a 1 story home.  Pt has 3 steps with a rail to enter home.   OCCUPATION: Pt is on disability retirement.  PLOF: Independent.  Pt has a hx of chronic lumbar pain which has affected her mobility and performance of daily activities.   PATIENT GOALS: to decrease pain.  To be able to do what she can and keep her mobility.    OBJECTIVE:   DIAGNOSTIC FINDINGS:  MRI in 2009 showed L4-5, L5-S1 multifactorial central stenosis, L4-5 mod to severe L subarticular recess stenosis, and L5-S1 subarticular lateral recess stenosis L > R with associated foraminal narrowing.  MRI in 2010 showed no change in stenosis at those levels including mild to mod L3-4 central canal stenosis.  Also showed broad based disc bulge.  Notes from [facility]:  Spine X-rays: L4-5, L5-S1 with facet arthrosis and L4-5 spondylolisthesis and MRI with central, lateral recess and foraminal stenosis L3-S1    TODAY'S TREATMENT:      Reviewed pt presentation, current function, pain level, and response to prior  Rx.  Pt performed   PPT x10  Supine marching with PPT x10  Hooklying ball squeeze with PPT x 10 reps  PT educated pt in palpation and performance of TrA contraction.  Pt performed supine TrA contraction with and without 5 sec holds.   Supine clams with GTB with TrA approx 15 reps  Supine lumbar rotation x10 reps    PATIENT EDUCATION:  Education details: dx, HEP, POC, exercise form and rationale, and relevant anatomy.  PT answered pt's questions.  Person educated: Patient Education method: Explanation, Demonstration, Tactile cues, Verbal cues, and Handouts Education comprehension: verbalized understanding, returned demonstration, verbal cues required, tactile cues required, and needs further education  HOME EXERCISE PROGRAM: Lateral shift correct exercise in standing with hands on wall, 2x/day, 2-5 reps with 10 sec hold  Updated HEP:  Access Code: [email or web address] URL: [email or web address] Date: 09/11/2022 Prepared by: Aaron Edelman  Exercises - Supine Posterior Pelvic Tilt  - 2 x daily - 7 x weekly - 2 sets - 10 reps - Supine March  - 1-2 x daily - 7 x weekly - 2 sets - 10 reps - Hooklying Lumbar Rotation  - 2 x daily - 7 x weekly - 2 sets - 10 reps  ASSESSMENT:  CLINICAL IMPRESSION: Pt reports she was having increased LE pain and saw her MD yesterday.  She received steroid and anti-inflammatory shots yesterday.  PT decreased volume and intensity of exercises today due to pt receiving shots yesterday.  PT monitored pain and sx's during Rx.  She performed exercises well with cuing for correct form.  Pt responded well to Rx stating her back and legs feel better after Rx though still has tingling.  Pt reports 5/10 pain afte Rx.  She should benefit from continued skilled PT services to address goals and impairments and improve overall function.      OBJECTIVE IMPAIRMENTS: Abnormal gait,  decreased activity tolerance, decreased endurance, decreased mobility, difficulty  walking, decreased ROM, decreased strength, hypomobility, impaired flexibility, postural dysfunction, and pain.   ACTIVITY LIMITATIONS: carrying, lifting, sitting, standing, bed mobility, and locomotion level  PARTICIPATION LIMITATIONS: meal prep, cleaning, shopping, and community activity  PERSONAL FACTORS: Time since onset of injury/illness/exacerbation are also affecting patient's functional outcome.   REHAB POTENTIAL: Good  CLINICAL DECISION MAKING: Stable/uncomplicated  EVALUATION COMPLEXITY: Low   GOALS:   SHORT TERM GOALS: Target date: 09/25/2022   Pt will be independent and compliant with HEP for improved pain, strength, ROM, and function.  Baseline: Goal status: INITIAL  2.  Pt will report at least a 25% improvement in pain and sx's overall for improved mobility.  Baseline:  Goal status: INITIAL  3.  Pt will be able to perform bed mobility without significant pain and difficulty  Baseline:  Goal status: INITIAL  4.  Pt will be able to cook without significant pain and difficulty.  Baseline:  Goal status: INITIAL Target date:  10/02/2022    LONG TERM GOALS: Target date: 10/16/2022   Pt will be able to stand and shower without increased pain.  Baseline:  Goal status: INITIAL  2.  Pt will be able to perform her normal ousehold chores without significant pain.   Baseline:  Goal status: INITIAL  3.  Pt will demo improved bilat Hip ER and knee flex and ext strength to 5/5 MMT for improved performance of and tolerance with functional mobility skills.  Baseline:  Goal status: INITIAL  4.  Pt will ambulate her normal community distance including shopping in the grocery store without significant pain.  Baseline:  Goal status: INITIAL    PLAN:  PT FREQUENCY: 2x/week  PT DURATION: 6 weeks  PLANNED INTERVENTIONS: Therapeutic exercises, Therapeutic activity, Neuromuscular re-education, Balance training, Gait training, Patient/Family education, Self Care,  Joint mobilization, Stair training, Aquatic Therapy, Dry Needling, Electrical stimulation, Cryotherapy, Moist heat, Taping, Ultrasound, Manual therapy, and Re-evaluation.  PLAN FOR NEXT SESSION:  Cont with aquatic and land therapy.  Review HEP.  Perform PPT, core strengthening, and nn flossing technique w/n tolerance.    Audie Clear III PT, DPT 09/27/22 11:49 PM

## 2022-10-01 ENCOUNTER — Ambulatory Visit (HOSPITAL_BASED_OUTPATIENT_CLINIC_OR_DEPARTMENT_OTHER): Payer: Medicare Other | Admitting: Physical Therapy

## 2022-10-01 ENCOUNTER — Encounter (HOSPITAL_BASED_OUTPATIENT_CLINIC_OR_DEPARTMENT_OTHER): Payer: Self-pay | Admitting: Physical Therapy

## 2022-10-01 DIAGNOSIS — M79604 Pain in right leg: Secondary | ICD-10-CM

## 2022-10-01 DIAGNOSIS — M5459 Other low back pain: Secondary | ICD-10-CM | POA: Diagnosis not present

## 2022-10-01 DIAGNOSIS — M79605 Pain in left leg: Secondary | ICD-10-CM

## 2022-10-01 DIAGNOSIS — M6281 Muscle weakness (generalized): Secondary | ICD-10-CM

## 2022-10-01 NOTE — Therapy (Signed)
OUTPATIENT PHYSICAL THERAPY TREATMENT NOTE   Patient Name: Judy Nguyen MRN: [id number] DOB:[birth year], 67 y.o., female Today's Date: 10/01/2022  END OF SESSION:  PT End of Session - 10/01/22 1044     Visit Number 5    Number of Visits 12    Date for PT Re-Evaluation 10/16/22    Authorization Type [employer] MCR    PT Start Time 1034    PT Stop Time 1115    PT Time Calculation (min) 41 min    Behavior During Therapy WFL for tasks assessed/performed              Past Medical History:  Diagnosis Date   Aortic atherosclerosis (HCC)    Asthma    Chronic back pain    Coronary artery calcification    Depression    Hyperlipidemia    Hypertension    Prediabetes    Past Surgical History:  Procedure Laterality Date   ABDOMINAL HYSTERECTOMY     WRIST FRACTURE SURGERY Right    Patient Active Problem List   Diagnosis Date Noted   Prediabetes 08/11/2021   Coronary artery calcification 08/11/2021   Aortic atherosclerosis (HCC) 08/11/2021   GERD (gastroesophageal reflux disease) 05/22/2019   Centrilobular emphysema (HCC) 05/22/2019   Healthcare maintenance 05/22/2019   Dyspnea 02/04/2019   Post-nasal drip 02/04/2019   Tobacco abuse 02/04/2019     REFERRING PROVIDER: Romero Belling, MD  REFERRING DIAG:  Lumbar stenosis; lumbago w/ radicular pain  Rationale for Evaluation and Treatment: Rehabilitation  THERAPY DIAG:  Other low back pain  Pain in left leg  Pain in right leg  Muscle weakness (generalized)  ONSET DATE: Chronic pain / Exacerbation in May 2023  SUBJECTIVE:                                                                                                                                                                                           SUBJECTIVE STATEMENT:  Pt reports she had a difficult week last week, with family situations (hospitalizations, etc).   Wasn't able to get to the pool.    PERTINENT HISTORY:  Per dx and L4-5  spondylolisthesis HTN  PAIN:  Are you having pain? Yes NPRS:  8/10 current,  Location:  Lumbar and t/o entire bilat LE's.   Type:  sharp, dull, achy, burning.  She also has tingling in bilat LE's   PRECAUTIONS: Other: L4-5 spondylolisthesis  WEIGHT BEARING RESTRICTIONS: No  FALLS:  Has patient fallen in last 6 months? No  HOME ENVIRONMENT: Pt lives alone in a 1 story home.  Pt has 3 steps with a rail to enter home.  OCCUPATION: Pt is on disability retirement.  PLOF: Independent.  Pt has a hx of chronic lumbar pain which has affected her mobility and performance of daily activities.   PATIENT GOALS: to decrease pain.  To be able to do what she can and keep her mobility.    OBJECTIVE:   DIAGNOSTIC FINDINGS:  MRI in 2009 showed L4-5, L5-S1 multifactorial central stenosis, L4-5 mod to severe L subarticular recess stenosis, and L5-S1 subarticular lateral recess stenosis L > R with associated foraminal narrowing.  MRI in 2010 showed no change in stenosis at those levels including mild to mod L3-4 central canal stenosis.  Also showed broad based disc bulge.  Notes from [facility]:  Spine X-rays: L4-5, L5-S1 with facet arthrosis and L4-5 spondylolisthesis and MRI with central, lateral recess and foraminal stenosis L3-S1    TODAY'S TREATMENT:         Pt seen for aquatic therapy today.  Treatment took place in water 3.5-4.75 ft in depth at the [facility] pool. Temp of water was 91.  Pt entered/exited the pool via stairs independently with bilat rail. * holding rainbow hand floats:  walking forward and backward (feels best facing lap pool)  * Side stepping with arm addct with rainbow hand floats x 3 laps * double rainbow hand floats at side with backward/ forward gait x 2 * alternating quick single row with walking forward/ backward  * TrA set with tricep push downs with yellow hand floats x 10 * TrA set with short blue noodle pull down x 10  * seated on noodle  like bike with UE holding yellow hand floats and pedaling LEs  (demo provided) - at 4 ft, then resting in this position for decompression * staggered stance (noodle between legs) with bilat horiz abdct/ addct x 10 each leg forward   Pt requires the buoyancy and hydrostatic pressure of water for support, and to offload joints by unweighting joint load by at least 50 % in navel deep water and by at least 75-80% in chest to neck deep water.  Viscosity of the water is needed for resistance of strengthening. Water current perturbations provides challenge to standing balance requiring increased core activation. PATIENT EDUCATION:  Education details: dx, HEP, POC, exercise form and rationale, and relevant anatomy.  PT answered pt's questions.  Person educated: Patient Education method: Explanation, Demonstration, Tactile cues, Verbal cues, and Handouts Education comprehension: verbalized understanding, returned demonstration, verbal cues required, tactile cues required, and needs further education  HOME EXERCISE PROGRAM: Lateral shift correct exercise in standing with hands on wall, 2x/day, 2-5 reps with 10 sec hold  Updated HEP:  Access Code: [email or web address] URL: [email or web address] Date: 09/11/2022 Prepared by: Aaron Edelman  Exercises - Supine Posterior Pelvic Tilt  - 2 x daily - 7 x weekly - 2 sets - 10 reps - Supine March  - 1-2 x daily - 7 x weekly - 2 sets - 10 reps - Hooklying Lumbar Rotation  - 2 x daily - 7 x weekly - 2 sets - 10 reps  ASSESSMENT:  CLINICAL IMPRESSION: Pt tolerated aquatic exercises well, reporting reduction of pain to 5/10 pain during session, and 0/10 when seated, suspended on noodle at end.  Discussed "less is more" principle and encouraged pt to not over-do it when exercising in water as this can exacerbate her symptoms; pt verbalized understanding.  She should benefit from continued skilled PT services to address goals and impairments and improve overall  function.  OBJECTIVE IMPAIRMENTS: Abnormal gait, decreased activity tolerance, decreased endurance, decreased mobility, difficulty walking, decreased ROM, decreased strength, hypomobility, impaired flexibility, postural dysfunction, and pain.   ACTIVITY LIMITATIONS: carrying, lifting, sitting, standing, bed mobility, and locomotion level  PARTICIPATION LIMITATIONS: meal prep, cleaning, shopping, and community activity  PERSONAL FACTORS: Time since onset of injury/illness/exacerbation are also affecting patient's functional outcome.   REHAB POTENTIAL: Good  CLINICAL DECISION MAKING: Stable/uncomplicated  EVALUATION COMPLEXITY: Low   GOALS:   SHORT TERM GOALS: Target date: 09/25/2022   Pt will be independent and compliant with HEP for improved pain, strength, ROM, and function.  Baseline: Goal status: INITIAL  2.  Pt will report at least a 25% improvement in pain and sx's overall for improved mobility.  Baseline:  Goal status: INITIAL  3.  Pt will be able to perform bed mobility without significant pain and difficulty  Baseline:  Goal status: INITIAL  4.  Pt will be able to cook without significant pain and difficulty.  Baseline:  Goal status: INITIAL Target date:  10/02/2022    LONG TERM GOALS: Target date: 10/16/2022   Pt will be able to stand and shower without increased pain.  Baseline:  Goal status: INITIAL  2.  Pt will be able to perform her normal ousehold chores without significant pain.   Baseline:  Goal status: INITIAL  3.  Pt will demo improved bilat Hip ER and knee flex and ext strength to 5/5 MMT for improved performance of and tolerance with functional mobility skills.  Baseline:  Goal status: INITIAL  4.  Pt will ambulate her normal community distance including shopping in the grocery store without significant pain.  Baseline:  Goal status: INITIAL    PLAN:  PT FREQUENCY: 2x/week  PT DURATION: 6 weeks  PLANNED INTERVENTIONS:  Therapeutic exercises, Therapeutic activity, Neuromuscular re-education, Balance training, Gait training, Patient/Family education, Self Care, Joint mobilization, Stair training, Aquatic Therapy, Dry Needling, Electrical stimulation, Cryotherapy, Moist heat, Taping, Ultrasound, Manual therapy, and Re-evaluation.  PLAN FOR NEXT SESSION:  Cont with aquatic and land therapy.  Review HEP.  Perform PPT, core strengthening, and nn flossing technique w/n tolerance.    Mayer Camel, PTA 10/01/22 11:15 AM [facility] [address] [place], [place], [address] Phone: [phone]   Fax:  [phone]

## 2022-10-03 ENCOUNTER — Encounter (HOSPITAL_BASED_OUTPATIENT_CLINIC_OR_DEPARTMENT_OTHER): Payer: Self-pay | Admitting: Physical Therapy

## 2022-10-03 ENCOUNTER — Ambulatory Visit (HOSPITAL_BASED_OUTPATIENT_CLINIC_OR_DEPARTMENT_OTHER): Payer: Medicare Other | Attending: Physical Medicine and Rehabilitation | Admitting: Physical Therapy

## 2022-10-03 DIAGNOSIS — M5459 Other low back pain: Secondary | ICD-10-CM | POA: Insufficient documentation

## 2022-10-03 DIAGNOSIS — M6281 Muscle weakness (generalized): Secondary | ICD-10-CM

## 2022-10-03 DIAGNOSIS — M79604 Pain in right leg: Secondary | ICD-10-CM

## 2022-10-03 DIAGNOSIS — M79605 Pain in left leg: Secondary | ICD-10-CM

## 2022-10-03 NOTE — Therapy (Signed)
OUTPATIENT PHYSICAL THERAPY TREATMENT NOTE   Patient Name: Judy Nguyen MRN: [id number] DOB:16-Sep-1955, 67 y.o., female Today's Date: 10/04/2022  END OF SESSION:  PT End of Session - 10/03/22 1409     Visit Number 6    Number of Visits 12    Date for PT Re-Evaluation 10/16/22    Authorization Type [employer] MCR    PT Start Time 1405    PT Stop Time 1446    PT Time Calculation (min) 41 min    Activity Tolerance Patient tolerated treatment well;No increased pain    Behavior During Therapy WFL for tasks assessed/performed               Past Medical History:  Diagnosis Date   Aortic atherosclerosis (HCC)    Asthma    Chronic back pain    Coronary artery calcification    Depression    Hyperlipidemia    Hypertension    Prediabetes    Past Surgical History:  Procedure Laterality Date   ABDOMINAL HYSTERECTOMY     WRIST FRACTURE SURGERY Right    Patient Active Problem List   Diagnosis Date Noted   Prediabetes 08/11/2021   Coronary artery calcification 08/11/2021   Aortic atherosclerosis (HCC) 08/11/2021   GERD (gastroesophageal reflux disease) 05/22/2019   Centrilobular emphysema (HCC) 05/22/2019   Healthcare maintenance 05/22/2019   Dyspnea 02/04/2019   Post-nasal drip 02/04/2019   Tobacco abuse 02/04/2019     REFERRING PROVIDER: Romero Belling, MD  REFERRING DIAG:  Lumbar stenosis; lumbago w/ radicular pain  Rationale for Evaluation and Treatment: Rehabilitation  THERAPY DIAG:  Other low back pain  Pain in left leg  Pain in right leg  Muscle weakness (generalized)  ONSET DATE: Chronic pain / Exacerbation in May 2023  SUBJECTIVE:                                                                                                                                                                                           SUBJECTIVE STATEMENT:  Pt states she was hurting all weekend.  She has been taking tramadol.  Pt had aquatic therapy on Monday and felt much  better after treatment.  Pt tried to go her pool at the gym in [facility] but the heater wasn't working.  Pt states she is hurting in her central lumbar though not having pain in her LE's.  Pt is having numbness in bilat LE's though not as bad today.  Pt states the medication from her shot from [place] "has actually kicked in now".  She has pain with turning over in bed and states it has been less over  the last few days.  Pt unable to roll into prone.    Pt reports compliance with HEP and states the exercises help.    PERTINENT HISTORY:  Per dx and L4-5 spondylolisthesis HTN  PAIN:  Are you having pain? Yes NPRS:  5-6/10 current, 20/10 worst, 5/10 best  Location:  central Lumbar .   Type:  sharp, dull, achy, burning.  She also has numbness in bilat LE's   PRECAUTIONS: Other: L4-5 spondylolisthesis  WEIGHT BEARING RESTRICTIONS: No  FALLS:  Has patient fallen in last 6 months? No  HOME ENVIRONMENT: Pt lives alone in a 1 story home.  Pt has 3 steps with a rail to enter home.   OCCUPATION: Pt is on disability retirement.  PLOF: Independent.  Pt has a hx of chronic lumbar pain which has affected her mobility and performance of daily activities.   PATIENT GOALS: to decrease pain.  To be able to do what she can and keep her mobility.    OBJECTIVE:   DIAGNOSTIC FINDINGS:  MRI in 2009 showed L4-5, L5-S1 multifactorial central stenosis, L4-5 mod to severe L subarticular recess stenosis, and L5-S1 subarticular lateral recess stenosis L > R with associated foraminal narrowing.  MRI in 2010 showed no change in stenosis at those levels including mild to mod L3-4 central canal stenosis.  Also showed broad based disc bulge.  Notes from [facility]:  Spine X-rays: L4-5, L5-S1 with facet arthrosis and L4-5 spondylolisthesis and MRI with central, lateral recess and foraminal stenosis L3-S1    TODAY'S TREATMENT:      Reviewed pt presentation, pain level, and response to prior Rx.  Pt  performed   PT educated pt in palpation and performance of TrA contraction.  Pt performed supine TrA contraction with and without 5 sec holds.   Supine marching with TrA x10 reps  Supine alt LE extension with TrA x 10 reps while holding ball x 10 reps  Hooklying ball squeeze with PPT x 10 reps  Supine clams with GTB with PPT 2 x 10 reps  Supine shoulder flex/ext with ball with PPT 2x10  Supine 90/90 nn flossing  Supine lumbar rotation x10 reps   Pt received supine manual HS stretch with passive AP's for neural flossing 2x30 sec bilat    PATIENT EDUCATION:  Education details: dx, HEP, POC, exercise form and rationale, and relevant anatomy.  PT answered pt's questions.  Person educated: Patient Education method: Explanation, Demonstration, Tactile cues, Verbal cues, and Handouts Education comprehension: verbalized understanding, returned demonstration, verbal cues required, tactile cues required, and needs further education  HOME EXERCISE PROGRAM: Lateral shift correct exercise in standing with hands on wall, 2x/day, 2-5 reps with 10 sec hold  Updated HEP:  Access Code: [email or web address] URL: [email or web address] Date: 09/11/2022 Prepared by: Aaron Edelman  Exercises - Supine Posterior Pelvic Tilt  - 2 x daily - 7 x weekly - 2 sets - 10 reps - Supine March  - 1-2 x daily - 7 x weekly - 2 sets - 10 reps - Hooklying Lumbar Rotation  - 2 x daily - 7 x weekly - 2 sets - 10 reps  ASSESSMENT:  CLINICAL IMPRESSION: Pt had increased pain though is feeling better now.  She had a good response to prior aquatic therapy and states she is now feeling the effects from the injection.  PT worked on Programme researcher, broadcasting/film/video.  She had good tolerance for exercises and performed exercises well with cuing and instruction in correct  form.  Pt able to perform supine alt LE ext without pain.  Pt responded well to Rx stating she felt a little better after Rx having a 4-5/10  pain.  She states her legs feel a little tired, but not in pain.  She had no change in numbness after Rx.  She should benefit from continued skilled PT services to address goals and impairments and improve overall function.      OBJECTIVE IMPAIRMENTS: Abnormal gait, decreased activity tolerance, decreased endurance, decreased mobility, difficulty walking, decreased ROM, decreased strength, hypomobility, impaired flexibility, postural dysfunction, and pain.   ACTIVITY LIMITATIONS: carrying, lifting, sitting, standing, bed mobility, and locomotion level  PARTICIPATION LIMITATIONS: meal prep, cleaning, shopping, and community activity  PERSONAL FACTORS: Time since onset of injury/illness/exacerbation are also affecting patient's functional outcome.   REHAB POTENTIAL: Good  CLINICAL DECISION MAKING: Stable/uncomplicated  EVALUATION COMPLEXITY: Low   GOALS:   SHORT TERM GOALS: Target date: 09/25/2022   Pt will be independent and compliant with HEP for improved pain, strength, ROM, and function.  Baseline: Goal status: INITIAL  2.  Pt will report at least a 25% improvement in pain and sx's overall for improved mobility.  Baseline:  Goal status: INITIAL  3.  Pt will be able to perform bed mobility without significant pain and difficulty  Baseline:  Goal status: INITIAL  4.  Pt will be able to cook without significant pain and difficulty.  Baseline:  Goal status: INITIAL Target date:  10/02/2022    LONG TERM GOALS: Target date: 10/16/2022   Pt will be able to stand and shower without increased pain.  Baseline:  Goal status: INITIAL  2.  Pt will be able to perform her normal ousehold chores without significant pain.   Baseline:  Goal status: INITIAL  3.  Pt will demo improved bilat Hip ER and knee flex and ext strength to 5/5 MMT for improved performance of and tolerance with functional mobility skills.  Baseline:  Goal status: INITIAL  4.  Pt will ambulate her normal  community distance including shopping in the grocery store without significant pain.  Baseline:  Goal status: INITIAL    PLAN:  PT FREQUENCY: 2x/week  PT DURATION: 6 weeks  PLANNED INTERVENTIONS: Therapeutic exercises, Therapeutic activity, Neuromuscular re-education, Balance training, Gait training, Patient/Family education, Self Care, Joint mobilization, Stair training, Aquatic Therapy, Dry Needling, Electrical stimulation, Cryotherapy, Moist heat, Taping, Ultrasound, Manual therapy, and Re-evaluation.  PLAN FOR NEXT SESSION:  Cont with aquatic and land therapy.  Review HEP.  Perform PPT, core strengthening, and nn flossing technique w/n tolerance.    Audie Clear III PT, DPT 10/04/22 9:45 PM

## 2022-10-09 ENCOUNTER — Encounter (HOSPITAL_BASED_OUTPATIENT_CLINIC_OR_DEPARTMENT_OTHER): Payer: Self-pay | Admitting: Physical Therapy

## 2022-10-09 ENCOUNTER — Ambulatory Visit (HOSPITAL_BASED_OUTPATIENT_CLINIC_OR_DEPARTMENT_OTHER): Payer: Medicare Other | Admitting: Physical Therapy

## 2022-10-09 DIAGNOSIS — M5459 Other low back pain: Secondary | ICD-10-CM | POA: Diagnosis not present

## 2022-10-09 DIAGNOSIS — M79604 Pain in right leg: Secondary | ICD-10-CM

## 2022-10-09 DIAGNOSIS — M79605 Pain in left leg: Secondary | ICD-10-CM

## 2022-10-09 DIAGNOSIS — M6281 Muscle weakness (generalized): Secondary | ICD-10-CM

## 2022-10-09 NOTE — Therapy (Signed)
OUTPATIENT PHYSICAL THERAPY TREATMENT NOTE   Patient Name: Judy Nguyen MRN: [id number] DOB:Feb 05, 1956, 67 y.o., female Today's Date: 10/09/2022  END OF SESSION:  PT End of Session - 10/09/22 1452     Visit Number 7    Number of Visits 12    Date for PT Re-Evaluation 10/16/22    Authorization Type [employer] MCR    PT Start Time 1446    PT Stop Time 1525    PT Time Calculation (min) 39 min    Behavior During Therapy WFL for tasks assessed/performed               Past Medical History:  Diagnosis Date   Aortic atherosclerosis (HCC)    Asthma    Chronic back pain    Coronary artery calcification    Depression    Hyperlipidemia    Hypertension    Prediabetes    Past Surgical History:  Procedure Laterality Date   ABDOMINAL HYSTERECTOMY     WRIST FRACTURE SURGERY Right    Patient Active Problem List   Diagnosis Date Noted   Prediabetes 08/11/2021   Coronary artery calcification 08/11/2021   Aortic atherosclerosis (HCC) 08/11/2021   GERD (gastroesophageal reflux disease) 05/22/2019   Centrilobular emphysema (HCC) 05/22/2019   Healthcare maintenance 05/22/2019   Dyspnea 02/04/2019   Post-nasal drip 02/04/2019   Tobacco abuse 02/04/2019     REFERRING PROVIDER: Romero Belling, MD  REFERRING DIAG:  Lumbar stenosis; lumbago w/ radicular pain  Rationale for Evaluation and Treatment: Rehabilitation  THERAPY DIAG:  Other low back pain  Pain in left leg  Pain in right leg  Muscle weakness (generalized)  ONSET DATE: Chronic pain / Exacerbation in May 2023  SUBJECTIVE:                                                                                                                                                                                           SUBJECTIVE STATEMENT:  Pt reports she had a flare up in symptoms since last land appt.  She plans to check out membership to [employer].    PERTINENT HISTORY:  Per dx and L4-5 spondylolisthesis HTN  PAIN:   Are you having pain? Yes NPRS: 7/10 Location:  central Lumbar down LE to knees .   Type:  sharp, dull, achy, burning.  She also has numbness in bilat LE's   PRECAUTIONS: Other: L4-5 spondylolisthesis  WEIGHT BEARING RESTRICTIONS: No  FALLS:  Has patient fallen in last 6 months? No  HOME ENVIRONMENT: Pt lives alone in a 1 story home.  Pt has 3 steps with a rail to enter home.   OCCUPATION:  Pt is on disability retirement.  PLOF: Independent.  Pt has a hx of chronic lumbar pain which has affected her mobility and performance of daily activities.   PATIENT GOALS: to decrease pain.  To be able to do what she can and keep her mobility.    OBJECTIVE:   DIAGNOSTIC FINDINGS:  MRI in 2009 showed L4-5, L5-S1 multifactorial central stenosis, L4-5 mod to severe L subarticular recess stenosis, and L5-S1 subarticular lateral recess stenosis L > R with associated foraminal narrowing.  MRI in 2010 showed no change in stenosis at those levels including mild to mod L3-4 central canal stenosis.  Also showed broad based disc bulge.  Notes from [facility]:  Spine X-rays: L4-5, L5-S1 with facet arthrosis and L4-5 spondylolisthesis and MRI with central, lateral recess and foraminal stenosis L3-S1    TODAY'S TREATMENT:     Pt seen for aquatic therapy today.  Treatment took place in water 3.5-4.75 ft in depth at the [facility] pool. Temp of water was 91.  Pt entered/exited the pool via stairs independently with bilat rail. * holding rainbow hand floats:  walking forward and backward (feels best facing lap pool)  * Side stepping with arm addct with rainbow hand floats x 3 laps * double rainbow hand floats at side with backward/ forward gait x 2, then 1 width with single hand float under water * alternating quick single row with walking forward/ backward  * TrA set with tricep push downs with yellow hand floats x 10 * staggered stance with bilat shoulder horiz abdct/ addct with yellow  hand floats 2 x 5 each LE * TrA set with short blue noodle pull down x 10  * seated on noodle like bike with UE holding yellow hand floats and pedaling LEs , suspended cc ski and suspended jumping jacks * STS at bench with forward arm reach and controlled descent x 7, with core engaged and neutral spine   Pt requires the buoyancy and hydrostatic pressure of water for support, and to offload joints by unweighting joint load by at least 50 % in navel deep water and by at least 75-80% in chest to neck deep water.  Viscosity of the water is needed for resistance of strengthening. Water current perturbations provides challenge to standing balance requiring increased core activation.     PATIENT EDUCATION:  Education details: POC, exercise form and rationale Education method: Explanation, Demonstration, Tactile cues, Verbal cues, Education comprehension: verbalized understanding, returned demonstration, verbal cues required, tactile cues required, and needs further education  HOME EXERCISE PROGRAM: Lateral shift correct exercise in standing with hands on wall, 2x/day, 2-5 reps with 10 sec hold  Updated HEP:  Access Code: [email or web address] URL: [email or web address] Date: 09/11/2022 Prepared by: Aaron Edelman  Exercises - Supine Posterior Pelvic Tilt  - 2 x daily - 7 x weekly - 2 sets - 10 reps - Supine March  - 1-2 x daily - 7 x weekly - 2 sets - 10 reps - Hooklying Lumbar Rotation  - 2 x daily - 7 x weekly - 2 sets - 10 reps  ASSESSMENT:  CLINICAL IMPRESSION: Modified HEP, to take out pelvic tilt (instead keeping neutral pelvis in hooklying position) and reducing lumbar rotation in hooklying (just rock). She reported elimination of pain completely when cycling while LEs suspended.  Pain returned to 5/10 when only 50% submerged.  She should benefit from continued skilled PT services to address goals and impairments and improve overall function.  Progressing towards goals. PT  to assess  STGs next visit.     OBJECTIVE IMPAIRMENTS: Abnormal gait, decreased activity tolerance, decreased endurance, decreased mobility, difficulty walking, decreased ROM, decreased strength, hypomobility, impaired flexibility, postural dysfunction, and pain.   ACTIVITY LIMITATIONS: carrying, lifting, sitting, standing, bed mobility, and locomotion level  PARTICIPATION LIMITATIONS: meal prep, cleaning, shopping, and community activity  PERSONAL FACTORS: Time since onset of injury/illness/exacerbation are also affecting patient's functional outcome.   REHAB POTENTIAL: Good  CLINICAL DECISION MAKING: Stable/uncomplicated  EVALUATION COMPLEXITY: Low   GOALS:   SHORT TERM GOALS: Target date: 09/25/2022   Pt will be independent and compliant with HEP for improved pain, strength, ROM, and function.  Baseline: Goal status: INITIAL  2.  Pt will report at least a 25% improvement in pain and sx's overall for improved mobility.  Baseline:  Goal status: INITIAL  3.  Pt will be able to perform bed mobility without significant pain and difficulty  Baseline:  Goal status: INITIAL  4.  Pt will be able to cook without significant pain and difficulty.  Baseline:  Goal status: INITIAL Target date:  10/02/2022    LONG TERM GOALS: Target date: 10/16/2022   Pt will be able to stand and shower without increased pain.  Baseline:  Goal status: INITIAL  2.  Pt will be able to perform her normal ousehold chores without significant pain.   Baseline:  Goal status: INITIAL  3.  Pt will demo improved bilat Hip ER and knee flex and ext strength to 5/5 MMT for improved performance of and tolerance with functional mobility skills.  Baseline:  Goal status: INITIAL  4.  Pt will ambulate her normal community distance including shopping in the grocery store without significant pain.  Baseline:  Goal status: INITIAL    PLAN:  PT FREQUENCY: 2x/week  PT DURATION: 6 weeks  PLANNED INTERVENTIONS:  Therapeutic exercises, Therapeutic activity, Neuromuscular re-education, Balance training, Gait training, Patient/Family education, Self Care, Joint mobilization, Stair training, Aquatic Therapy, Dry Needling, Electrical stimulation, Cryotherapy, Moist heat, Taping, Ultrasound, Manual therapy, and Re-evaluation.  PLAN FOR NEXT SESSION:  Cont with aquatic and land therapy.  Review HEP.  Perform PPT, core strengthening, and nn flossing technique w/n tolerance.   Mayer Camel, PTA 10/09/22 6:11 PM [facility] [address] [place], [place], [address] Phone: [phone]   Fax:  [phone]

## 2022-10-11 ENCOUNTER — Ambulatory Visit (HOSPITAL_BASED_OUTPATIENT_CLINIC_OR_DEPARTMENT_OTHER): Payer: Medicare Other | Admitting: Physical Therapy

## 2022-10-11 DIAGNOSIS — M5459 Other low back pain: Secondary | ICD-10-CM | POA: Diagnosis not present

## 2022-10-11 DIAGNOSIS — M6281 Muscle weakness (generalized): Secondary | ICD-10-CM

## 2022-10-11 DIAGNOSIS — M79604 Pain in right leg: Secondary | ICD-10-CM

## 2022-10-11 DIAGNOSIS — M79605 Pain in left leg: Secondary | ICD-10-CM

## 2022-10-11 NOTE — Therapy (Signed)
OUTPATIENT PHYSICAL THERAPY TREATMENT NOTE   Patient Name: Judy Nguyen MRN: [id number] DOB:1956-02-03, 67 y.o., female Today's Date: 10/12/2022  END OF SESSION:  PT End of Session - 10/11/22 1452     Visit Number 8    Number of Visits 12    Date for PT Re-Evaluation 10/16/22    Authorization Type [employer] MCR    PT Start Time 1412    PT Stop Time 1451    PT Time Calculation (min) 39 min    Activity Tolerance Patient tolerated treatment well;No increased pain    Behavior During Therapy WFL for tasks assessed/performed                Past Medical History:  Diagnosis Date   Aortic atherosclerosis (HCC)    Asthma    Chronic back pain    Coronary artery calcification    Depression    Hyperlipidemia    Hypertension    Prediabetes    Past Surgical History:  Procedure Laterality Date   ABDOMINAL HYSTERECTOMY     WRIST FRACTURE SURGERY Right    Patient Active Problem List   Diagnosis Date Noted   Prediabetes 08/11/2021   Coronary artery calcification 08/11/2021   Aortic atherosclerosis (HCC) 08/11/2021   GERD (gastroesophageal reflux disease) 05/22/2019   Centrilobular emphysema (HCC) 05/22/2019   Healthcare maintenance 05/22/2019   Dyspnea 02/04/2019   Post-nasal drip 02/04/2019   Tobacco abuse 02/04/2019     REFERRING PROVIDER: Romero Belling, MD  REFERRING DIAG:  Lumbar stenosis; lumbago w/ radicular pain  Rationale for Evaluation and Treatment: Rehabilitation  THERAPY DIAG:  Other low back pain  Pain in left leg  Pain in right leg  Muscle weakness (generalized)  ONSET DATE: Chronic pain / Exacerbation in May 2023  SUBJECTIVE:                                                                                                                                                                                           SUBJECTIVE STATEMENT:  Pt had increased pain after prior land based Rx and called PT.  PT spoke with pt answering questions and giving  advice .  PT informed pt we would focus more on aquatic therapy at this time due to increased pain with land based exercises.  "It was a rough weekend."  Pt was feeling better by Tuesday when she had aquatic therapy.  Pt states she felt good after prior aquatic Rx.  Pt modified some of her home exercises as therapist directed yesterday.  Pt states she has felt relief from therapy.  Pt reports 50% improvement in pain and sx's overall  and 60% improvement with bed mobility.  Pt joined [employer].    PERTINENT HISTORY:  Per dx and L4-5 spondylolisthesis HTN  PAIN:  Are you having pain? Yes NPRS: 5/10 Location:  bilat lumbar sides down to distal calf L > R   Type:  sharp, achy, light burning.  She denies numbness in bilat LE's   PRECAUTIONS: Other: L4-5 spondylolisthesis  WEIGHT BEARING RESTRICTIONS: No  FALLS:  Has patient fallen in last 6 months? No  HOME ENVIRONMENT: Pt lives alone in a 1 story home.  Pt has 3 steps with a rail to enter home.   OCCUPATION: Pt is on disability retirement.  PLOF: Independent.  Pt has a hx of chronic lumbar pain which has affected her mobility and performance of daily activities.   PATIENT GOALS: to decrease pain.  To be able to do what she can and keep her mobility.    OBJECTIVE:   DIAGNOSTIC FINDINGS:  MRI in 2009 showed L4-5, L5-S1 multifactorial central stenosis, L4-5 mod to severe L subarticular recess stenosis, and L5-S1 subarticular lateral recess stenosis L > R with associated foraminal narrowing.  MRI in 2010 showed no change in stenosis at those levels including mild to mod L3-4 central canal stenosis.  Also showed broad based disc bulge.  Notes from [facility]:  Spine X-rays: L4-5, L5-S1 with facet arthrosis and L4-5 spondylolisthesis and MRI with central, lateral recess and foraminal stenosis L3-S1    TODAY'S TREATMENT:     Pt seen for aquatic therapy today.  Treatment took place in water 3.5-4.75 ft in depth at the [facility] pool. Temp of water was 91.  Pt entered/exited the pool via stairs independently with bilat rail. * Pt ambulated 3 laps fwd/bwd *holding rainbow hand floats:  walking forward and backward   * Side stepping with arm addct with rainbow hand floats x 3 laps * double rainbow hand floats at side with backward/ forward gait x 1 lap, and 1 lap with single hand float under water * TrA set with short blue noodle pull down x 10  *kickboard push/pull with TrA 2x10 * seated on noodle like bike with UE holding yellow hand floats and pedaling LE's, suspended jumping jacks * STS at bench with forward arm reach and controlled descent x 6, with core engaged and neutral spine   Pt requires the buoyancy and hydrostatic pressure of water for support, and to offload joints by unweighting joint load by at least 50 % in navel deep water and by at least 75-80% in chest to neck deep water.  Viscosity of the water is needed for resistance of strengthening. Water current perturbations provides challenge to standing balance requiring increased core activation.     PATIENT EDUCATION:  Education details: POC, exercise form and rationale Education method: Explanation, Demonstration, Tactile cues, Verbal cues, Education comprehension: verbalized understanding, returned demonstration, verbal cues required, tactile cues required, and needs further education  HOME EXERCISE PROGRAM: Lateral shift correct exercise in standing with hands on wall, 2x/day, 2-5 reps with 10 sec hold  Updated HEP:  Access Code: [email or web address] URL: [email or web address] Date: 09/11/2022 Prepared by: Aaron Edelman  Exercises - Supine Posterior Pelvic Tilt  - 2 x daily - 7 x weekly - 2 sets - 10 reps - Supine March  - 1-2 x daily - 7 x weekly - 2 sets - 10 reps - Hooklying Lumbar Rotation  - 2 x daily - 7 x weekly - 2 sets - 10 reps  ASSESSMENT:  CLINICAL IMPRESSION: Pt had increased pain after prior land based therapy.   Pt was seen for aquatic therapy due to increased pain with land treatment.  Pt is making progress overall. Pt reports 50%/60% improvement in pain and sx's overall/bed mobility.  Pt met STG #1,2 and partially met #3.  Pt had good tolerance with aquatic exercises.  Pt performed aquatic exercises well with cuing and instruction in correct form.  Pt states she feels good with the suspended exercises in the deeper end of the pool.  Pt responded well to Rx reporting no increased pain after Rx.  PT will focus on aquatic therapy at this time.     OBJECTIVE IMPAIRMENTS: Abnormal gait, decreased activity tolerance, decreased endurance, decreased mobility, difficulty walking, decreased ROM, decreased strength, hypomobility, impaired flexibility, postural dysfunction, and pain.   ACTIVITY LIMITATIONS: carrying, lifting, sitting, standing, bed mobility, and locomotion level  PARTICIPATION LIMITATIONS: meal prep, cleaning, shopping, and community activity  PERSONAL FACTORS: Time since onset of injury/illness/exacerbation are also affecting patient's functional outcome.   REHAB POTENTIAL: Good  CLINICAL DECISION MAKING: Stable/uncomplicated  EVALUATION COMPLEXITY: Low   GOALS:   SHORT TERM GOALS: Target date: 09/25/2022   Pt will be independent and compliant with HEP for improved pain, strength, ROM, and function.  Baseline: Goal status: GOAL MET  2.  Pt will report at least a 25% improvement in pain and sx's overall for improved mobility.  Baseline:  Goal status: GOAL MET  3.  Pt will be able to perform bed mobility without significant pain and difficulty  Baseline: Pt reports 60% better--5/9 Goal status: PROGRESSING  4.  Pt will be able to cook without significant pain and difficulty.  Baseline:  Goal status: ONGOING Target date:  10/02/2022    LONG TERM GOALS: Target date: 10/16/2022   Pt will be able to stand and shower without increased pain.  Baseline:  Goal status:  INITIAL  2.  Pt will be able to perform her normal ousehold chores without significant pain.   Baseline:  Goal status: INITIAL  3.  Pt will demo improved bilat Hip ER and knee flex and ext strength to 5/5 MMT for improved performance of and tolerance with functional mobility skills.  Baseline:  Goal status: INITIAL  4.  Pt will ambulate her normal community distance including shopping in the grocery store without significant pain.  Baseline:  Goal status: INITIAL    PLAN:  PT FREQUENCY: 2x/week  PT DURATION: 6 weeks  PLANNED INTERVENTIONS: Therapeutic exercises, Therapeutic activity, Neuromuscular re-education, Balance training, Gait training, Patient/Family education, Self Care, Joint mobilization, Stair training, Aquatic Therapy, Dry Needling, Electrical stimulation, Cryotherapy, Moist heat, Taping, Ultrasound, Manual therapy, and Re-evaluation.  PLAN FOR NEXT SESSION:  Cont with aquatic therapy at this time and will try land when pt's tolerance improves.    Audie Clear III PT, DPT 10/12/22 2:07 PM

## 2022-10-12 ENCOUNTER — Encounter (HOSPITAL_BASED_OUTPATIENT_CLINIC_OR_DEPARTMENT_OTHER): Payer: Self-pay | Admitting: Physical Therapy

## 2022-10-16 ENCOUNTER — Encounter (HOSPITAL_BASED_OUTPATIENT_CLINIC_OR_DEPARTMENT_OTHER): Payer: Self-pay | Admitting: Physical Therapy

## 2022-10-16 ENCOUNTER — Ambulatory Visit (HOSPITAL_BASED_OUTPATIENT_CLINIC_OR_DEPARTMENT_OTHER): Payer: Medicare Other | Admitting: Physical Therapy

## 2022-10-16 DIAGNOSIS — M6281 Muscle weakness (generalized): Secondary | ICD-10-CM

## 2022-10-16 DIAGNOSIS — M5459 Other low back pain: Secondary | ICD-10-CM

## 2022-10-16 DIAGNOSIS — M79604 Pain in right leg: Secondary | ICD-10-CM

## 2022-10-16 DIAGNOSIS — M79605 Pain in left leg: Secondary | ICD-10-CM

## 2022-10-16 NOTE — Therapy (Addendum)
OUTPATIENT PHYSICAL THERAPY TREATMENT NOTE   Patient Name: Judy Nguyen MRN: [id number] DOB:[birth year], 67 y.o., female Today's Date: 10/16/2022  END OF SESSION:  PT End of Session - 10/16/22 1414     Visit Number 9    Number of Visits 12    Date for PT Re-Evaluation 10/16/22    Authorization Type [employer] MCR    PT Start Time 1406    PT Stop Time 1445    PT Time Calculation (min) 39 min    Behavior During Therapy WFL for tasks assessed/performed                Past Medical History:  Diagnosis Date   Aortic atherosclerosis (HCC)    Asthma    Chronic back pain    Coronary artery calcification    Depression    Hyperlipidemia    Hypertension    Prediabetes    Past Surgical History:  Procedure Laterality Date   ABDOMINAL HYSTERECTOMY     WRIST FRACTURE SURGERY Right    Patient Active Problem List   Diagnosis Date Noted   Prediabetes 08/11/2021   Coronary artery calcification 08/11/2021   Aortic atherosclerosis (HCC) 08/11/2021   GERD (gastroesophageal reflux disease) 05/22/2019   Centrilobular emphysema (HCC) 05/22/2019   Healthcare maintenance 05/22/2019   Dyspnea 02/04/2019   Post-nasal drip 02/04/2019   Tobacco abuse 02/04/2019     REFERRING PROVIDER: Romero Belling, MD  REFERRING DIAG:  Lumbar stenosis; lumbago w/ radicular pain  Rationale for Evaluation and Treatment: Rehabilitation  THERAPY DIAG:  Other low back pain  Pain in left leg  Pain in right leg  Muscle weakness (generalized)  ONSET DATE: Chronic pain / Exacerbation in May 2023  SUBJECTIVE:                                                                                                                                                                                           SUBJECTIVE STATEMENT:  Pt had increased pain after cleaning garage on Friday.  "It was a rough weekend."   Pt joined [facility], but hasn't gotten to the pool yet.    PERTINENT HISTORY:  Per dx and L4-5  spondylolisthesis HTN  PAIN:  Are you having pain? Yes NPRS: 6/10 Location:  bilat lumbar sides down to distal calf L > R   Type:  sharp, achy, light burning.  She denies numbness in bilat LE's   PRECAUTIONS: Other: L4-5 spondylolisthesis  WEIGHT BEARING RESTRICTIONS: No  FALLS:  Has patient fallen in last 6 months? No  HOME ENVIRONMENT: Pt lives alone in a 1 story home.  Pt has 3 steps with  a rail to enter home.   OCCUPATION: Pt is on disability retirement.  PLOF: Independent.  Pt has a hx of chronic lumbar pain which has affected her mobility and performance of daily activities.   PATIENT GOALS: to decrease pain.  To be able to do what she can and keep her mobility.    OBJECTIVE:   DIAGNOSTIC FINDINGS:  MRI in 2009 showed L4-5, L5-S1 multifactorial central stenosis, L4-5 mod to severe L subarticular recess stenosis, and L5-S1 subarticular lateral recess stenosis L > R with associated foraminal narrowing.  MRI in 2010 showed no change in stenosis at those levels including mild to mod L3-4 central canal stenosis.  Also showed broad based disc bulge.  Notes from [facility]:  Spine X-rays: L4-5, L5-S1 with facet arthrosis and L4-5 spondylolisthesis and MRI with central, lateral recess and foraminal stenosis L3-S1    TODAY'S TREATMENT:     Pt seen for aquatic therapy today.  Treatment took place in water 3.5-4.75 ft in depth at the [facility] pool. Temp of water was 91.  Pt entered/exited the pool via stairs independently with bilat rail.  *without support in 4+ ft of water: walking forward/ backward  with reciprocal arm swing * Side stepping with arm addct with rainbow hand floats x 3 laps  * holding bilat then single hand float under water at side, walking forward/backward * staggered stance with yellow hand floats: tricep press downs x 5 each;  horiz shoulder addct/abdct x 5 each * Overhead bilat arm stretch, with gentle lateral flexion x 5sec each * L  stretch at wall with gentle tail wagging x 10s x 2 * TrA set with short blue noodle pull down to thighs,with 2 isometric holds on return to surface x 10 * holding yellow noodle:  alternating single leg clams x 5 each * straddling yellow noodle with UE on rainbow hand floats:  cycling in deeper water  * double rainbow hand floats at side with backward/ forward gait x 1 lap, and 1 lap with single hand float under water * TrA set with short blue noodle pull down x 10  *kickboard push/pull with TrA 2x10 * seated on noodle like bike with UE holding yellow hand floats and pedaling LE's, suspended jumping jacks * STS at bench with forward arm reach and controlled descent x 6, with core engaged and neutral spine   Pt requires the buoyancy and hydrostatic pressure of water for support, and to offload joints by unweighting joint load by at least 50 % in navel deep water and by at least 75-80% in chest to neck deep water.  Viscosity of the water is needed for resistance of strengthening. Water current perturbations provides challenge to standing balance requiring increased core activation.     PATIENT EDUCATION:  Education details: exercise form and rationale Education method: Explanation, Demonstration, Tactile cues, Verbal cues, Education comprehension: verbalized understanding, returned demonstration, verbal cues required, tactile cues required, and needs further education  HOME EXERCISE PROGRAM: Lateral shift correct exercise in standing with hands on wall, 2x/day, 2-5 reps with 10 sec hold  Access Code: [email or web address] URL: [email or web address] Date: 09/11/2022 Prepared by: Aaron Edelman  AQUATIC HEP Access Code: [email or web address] URL: [email or web address] Date: 10/16/2022  (not issued yet) Prepared by: [facility] This aquatic home exercise program from [employer] utilizes pictures from land based exercises, but has been adapted prior to  lamination and issuance.   ASSESSMENT:  CLINICAL IMPRESSION: Pt arrived with elevated pain from  working in garage (forward flexed) this weekend. Pt had good tolerance with aquatic exercises, reporting gradual reduction of pain.  Pt performed aquatic exercises well with only minimal cuing for correct form.   Will continue to focus on aquatic therapy at this time. Pt has partially met her goals. PT to assess goals for recert next visit. Recommend 1x/wk for 3-4 wks while therapists progress her aquatic HEP.      OBJECTIVE IMPAIRMENTS: Abnormal gait, decreased activity tolerance, decreased endurance, decreased mobility, difficulty walking, decreased ROM, decreased strength, hypomobility, impaired flexibility, postural dysfunction, and pain.   ACTIVITY LIMITATIONS: carrying, lifting, sitting, standing, bed mobility, and locomotion level  PARTICIPATION LIMITATIONS: meal prep, cleaning, shopping, and community activity  PERSONAL FACTORS: Time since onset of injury/illness/exacerbation are also affecting patient's functional outcome.   REHAB POTENTIAL: Good  CLINICAL DECISION MAKING: Stable/uncomplicated  EVALUATION COMPLEXITY: Low   GOALS:   SHORT TERM GOALS: Target date: 09/25/2022   Pt will be independent and compliant with HEP for improved pain, strength, ROM, and function.  Baseline: Goal status: GOAL MET  2.  Pt will report at least a 25% improvement in pain and sx's overall for improved mobility.  Baseline:  Goal status: GOAL MET  3.  Pt will be able to perform bed mobility without significant pain and difficulty  Baseline: Pt reports 60% better--5/9 Goal status: PROGRESSING  4.  Pt will be able to cook without significant pain and difficulty.  Baseline:  Goal status: PROGRESSING - 40-50% improved 5/14 Target date:  10/02/2022    LONG TERM GOALS: Target date: 10/16/2022   Pt will be able to stand and shower without increased pain.  Baseline:  Goal status: PARTIALLY  MET 5/14  2.  Pt will be able to perform her normal household chores without significant pain.   Baseline: 50% improved  Goal status:PARTIALLY MET 5/14  3.  Pt will demo improved bilat Hip ER and knee flex and ext strength to 5/5 MMT for improved performance of and tolerance with functional mobility skills.  Baseline:  Goal status: INITIAL  4.  Pt will ambulate her normal community distance including shopping in the grocery store without significant pain.  Baseline:  Goal status: MET -5/14    PLAN:  PT FREQUENCY: 2x/week  PT DURATION: 6 weeks  PLANNED INTERVENTIONS: Therapeutic exercises, Therapeutic activity, Neuromuscular re-education, Balance training, Gait training, Patient/Family education, Self Care, Joint mobilization, Stair training, Aquatic Therapy, Dry Needling, Electrical stimulation, Cryotherapy, Moist heat, Taping, Ultrasound, Manual therapy, and Re-evaluation.  PLAN FOR NEXT SESSION:  assess goals for recert   Mayer Camel, PTA 10/16/22 2:50 PM [facility] [address] [place], [place], [address] Phone: [phone]   Fax:  [phone]

## 2022-10-18 ENCOUNTER — Ambulatory Visit (HOSPITAL_BASED_OUTPATIENT_CLINIC_OR_DEPARTMENT_OTHER): Payer: Medicare Other | Admitting: Physical Therapy

## 2022-10-18 DIAGNOSIS — M6281 Muscle weakness (generalized): Secondary | ICD-10-CM

## 2022-10-18 DIAGNOSIS — M79605 Pain in left leg: Secondary | ICD-10-CM

## 2022-10-18 DIAGNOSIS — M79604 Pain in right leg: Secondary | ICD-10-CM

## 2022-10-18 DIAGNOSIS — M5459 Other low back pain: Secondary | ICD-10-CM

## 2022-10-18 NOTE — Therapy (Signed)
OUTPATIENT PHYSICAL THERAPY TREATMENT NOTE / PROGRESS NOTE Progress Note Reporting Period 09/04/2022 to 10/18/2022  See note below for Objective Data and Assessment of Progress/Goals.       Patient Name: Judy Nguyen MRN: [id number] DOB:[birth year], 67 y.o., female Today's Date: 10/19/2022  END OF SESSION:  PT End of Session - 10/18/22 1453     Visit Number 10    Number of Visits 14    Date for PT Re-Evaluation 11/15/22    Authorization Type [employer] MCR    PT Start Time 1400    PT Stop Time 1450    PT Time Calculation (min) 50 min    Activity Tolerance Patient tolerated treatment well    Behavior During Therapy WFL for tasks assessed/performed                Past Medical History:  Diagnosis Date   Aortic atherosclerosis (HCC)    Asthma    Chronic back pain    Coronary artery calcification    Depression    Hyperlipidemia    Hypertension    Prediabetes    Past Surgical History:  Procedure Laterality Date   ABDOMINAL HYSTERECTOMY     WRIST FRACTURE SURGERY Right    Patient Active Problem List   Diagnosis Date Noted   Prediabetes 08/11/2021   Coronary artery calcification 08/11/2021   Aortic atherosclerosis (HCC) 08/11/2021   GERD (gastroesophageal reflux disease) 05/22/2019   Centrilobular emphysema (HCC) 05/22/2019   Healthcare maintenance 05/22/2019   Dyspnea 02/04/2019   Post-nasal drip 02/04/2019   Tobacco abuse 02/04/2019     REFERRING PROVIDER: Romero Belling, MD  REFERRING DIAG:  Lumbar stenosis; lumbago w/ radicular pain  Rationale for Evaluation and Treatment: Rehabilitation  THERAPY DIAG:  Other low back pain  Pain in left leg  Pain in right leg  Muscle weakness (generalized)  ONSET DATE: Chronic pain / Exacerbation in May 2023  SUBJECTIVE:                                                                                                                                                                                            SUBJECTIVE STATEMENT: RESPONSE TO PRIOR RX:  Pt denies any adverse effects after prior Rx.  HEP COMPLIANCE:  Pt reports compliance with HEP.  Pt reports the exercises have helped. FUNCTIONAL IMPROVEMENTS:  pain--Pt reports reduced severity of pain. mobility, understanding her tolerance, getting OOB in the AM FUNCTIONAL LIMITATIONS:  Pt states she has pain with "everything".  Household chores including cleaning and cooking.  Shopping and lifting grocery bags.  Ambulation distance including in grocery store.  Standing and sitting tolerance.  Difficulty getting to sleep.     PERTINENT HISTORY:  Per dx and L4-5 spondylolisthesis HTN  PAIN:  Are you having pain? Yes NPRS: 5-6/10 current, 20/10 worst, 4-5/10 best Location:  bilat lumbar sides down to distal calf L > R   Type:  sharp, achy, light burning.  She denies numbness in bilat LE's   PRECAUTIONS: Other: L4-5 spondylolisthesis  WEIGHT BEARING RESTRICTIONS: No  FALLS:  Has patient fallen in last 6 months? No  HOME ENVIRONMENT: Pt lives alone in a 1 story home.  Pt has 3 steps with a rail to enter home.   OCCUPATION: Pt is on disability retirement.  PLOF: Independent.  Pt has a hx of chronic lumbar pain which has affected her mobility and performance of daily activities.   PATIENT GOALS: to decrease pain.  To be able to do what she can and keep her mobility.    OBJECTIVE:   DIAGNOSTIC FINDINGS:  MRI in 2009 showed L4-5, L5-S1 multifactorial central stenosis, L4-5 mod to severe L subarticular recess stenosis, and L5-S1 subarticular lateral recess stenosis L > R with associated foraminal narrowing.  MRI in 2010 showed no change in stenosis at those levels including mild to mod L3-4 central canal stenosis.  Also showed broad based disc bulge.  Notes from [facility]:  Spine X-rays: L4-5, L5-S1 with facet arthrosis and L4-5 spondylolisthesis and MRI with central, lateral recess and foraminal stenosis L3-S1    TODAY'S  TREATMENT:     PATIENT SURVEYS:  FOTO: Initial/Current: 44/53 with a goal of 52 at visit 12   POSTURE: slight lateral shift to Right which has improved since initial eval      LUMBAR ROM:    AROM eval 5/16  Flexion [facility] WFL with pain returning to extension  Extension     Right lateral flexion 75% with pain WFL without pain  Left lateral flexion [facility] WFL  Right rotation 50% with pain 50% without pain  Left rotation 50% 50%   (Blank rows = not tested)   LOWER EXTREMITY MMT:      Strength  Right eval Left eval Right 5/16 Left 5/16  Hip flexion 5/5 5/5 5/5 5/5  Hip extension        Hip abduction        Hip adduction        Hip internal rotation        Hip external rotation 4-/5 4/5 5/5 5/5  Knee flexion 4-/5 seated 4-/5 seated 4/5 seated 4-/5 seated  Knee extension 4+/5 4+/5 5/5 5/5  Ankle dorsiflexion        Ankle plantarflexion        Ankle inversion        Ankle eversion         (Blank rows = not tested)   GAIT: Assistive device utilized: None Level of assistance: Complete Independence Comments: Improved step length, decreased gait speed, limited pelvic rotation, and limited reciprocal arm swing.      Pt seen for aquatic therapy today.  Treatment took place in water 3.5  to approx 4. ft 6 inch depth at the [facility] pool. Temp of water was 91.  Pt entered/exited the pool via stairs independently with bilat rail.  *without support in 4+ ft of water: walking forward/ backward  with reciprocal arm swing x 1 lap * Side stepping with arm addct with rainbow hand floats x 1 lap * standing horiz shoulder addct/abdct x 10  * L stretch at wall  with gentle tail wagging x 10s x 2 * holding yellow noodle:  alternating single leg clams 2 x 5 each * straddling yellow noodle with UE on hand floats:  cycling in deeper water * TrA set with short blue noodle pull down x 10  *kickboard push/pull with TrA 2x10 in staggered stance   PT gave pt an aquatic program and  educated pt in correct form.     Pt requires the buoyancy and hydrostatic pressure of water for support, and to offload joints by unweighting joint load by at least 50 % in navel deep water and by at least 75-80% in chest to neck deep water.  Viscosity of the water is needed for resistance of strengthening. Water current perturbations provides challenge to standing balance requiring increased core activation.     PATIENT EDUCATION:  Education details: exercise form and rationale, POC, and objective findings Education method: Explanation, Demonstration, Tactile cues, Verbal cues, Education comprehension: verbalized understanding, returned demonstration, verbal cues required, tactile cues required, and needs further education  HOME EXERCISE PROGRAM: Lateral shift correct exercise in standing with hands on wall, 2x/day, 2-5 reps with 10 sec hold  Access Code: [email or web address] URL: [email or web address] Date: 09/11/2022 Prepared by: Aaron Edelman  AQUATIC HEP Access Code: [email or web address] URL: [email or web address] Date: 10/16/2022  (not issued yet) Prepared by: [facility] - [address] This aquatic home exercise program from [employer] utilizes pictures from land based exercises, but has been adapted prior to lamination and issuance.   ASSESSMENT:  CLINICAL IMPRESSION: Pt presents to Rx for a progress note f/b aquatic therapy.  Pt reports improved pain though continues to have significant pain.  Pt enjoys the aquatic exercises and states they have helped.  Pt reports improved mobility including getting OOB in the AM.  She continues to be limited with standing and sitting tolerance performing household chores due to pain.  Pt is limited with grocery shopping due to pain.  Pt has improved posture and reduced lateral shift to R.  She had less pain with lumbar ROM and improved R lateral flexion.  Pt demonstrates improve bilat LE strength.  Pt demonstrates improved  self perceived disability with FOTO score improving from 44 initially to 53.  Pt met her FOTO goal.  PT gave pt an aquatic program and went thru exercises with pt.  Pt performed aquatic exercises well with minimal cuing for correct form.  Pt has met STG's #1,2 and partially met LTG's #1-3.  Pt should benefit from cont skilled PT services including aquatic therapy for improved pain, strength, function, tolerance to activity, and mobility and to establish independence with aquatic HEP.      OBJECTIVE IMPAIRMENTS: Abnormal gait, decreased activity tolerance, decreased endurance, decreased mobility, difficulty walking, decreased ROM, decreased strength, hypomobility, impaired flexibility, postural dysfunction, and pain.   ACTIVITY LIMITATIONS: carrying, lifting, sitting, standing, bed mobility, and locomotion level  PARTICIPATION LIMITATIONS: meal prep, cleaning, shopping, and community activity  PERSONAL FACTORS: Time since onset of injury/illness/exacerbation are also affecting patient's functional outcome.   REHAB POTENTIAL: Good  CLINICAL DECISION MAKING: Stable/uncomplicated  EVALUATION COMPLEXITY: Low   GOALS:   SHORT TERM GOALS: Target date: 09/25/2022   Pt will be independent and compliant with HEP for improved pain, strength, ROM, and function.  Baseline: Goal status: GOAL MET  2.  Pt will report at least a 25% improvement in pain and sx's overall for improved mobility.  Baseline:  Goal status: GOAL MET  3.  Pt will be able to perform  bed mobility without significant pain and difficulty  Baseline: Pt reports 60% better--5/9 Goal status: PROGRESSING  4.  Pt will be able to cook without significant pain and difficulty.  Baseline:  Goal status: PROGRESSING - 40-50% improved 5/14.  Target date:  10/02/2022    LONG TERM GOALS: Target date: 11/15/2022   Pt will be able to stand and shower without increased pain.  Baseline:  Goal status: PARTIALLY MET 5/14.   2.  Pt  will be able to perform her normal household chores without significant pain.   Baseline: 50% improved  Goal status:PARTIALLY MET 5/14.  3.  Pt will demo improved bilat Hip ER and knee flex and ext strength to 5/5 MMT for improved performance of and tolerance with functional mobility skills.  Baseline:  Goal status: PARTIALLY MET.  5/16  4.  Pt will ambulate her normal community distance including shopping in the grocery store without significant pain.  Baseline:  Goal status: ONGOING 5/16    PLAN:  PT FREQUENCY: 1x/wk    PT DURATION: 4 weeks  PLANNED INTERVENTIONS: Therapeutic exercises, Therapeutic activity, Neuromuscular re-education, Balance training, Gait training, Patient/Family education, Self Care, Joint mobilization, Stair training, Aquatic Therapy, Dry Needling, Electrical stimulation, Cryotherapy, Moist heat, Taping, Ultrasound, Manual therapy, and Re-evaluation.  PLAN FOR NEXT SESSION:  Pt to continue with aquatic therapy.   Audie Clear III PT, DPT 10/19/22 12:06 PM

## 2022-10-19 ENCOUNTER — Encounter (HOSPITAL_BASED_OUTPATIENT_CLINIC_OR_DEPARTMENT_OTHER): Payer: Self-pay | Admitting: Physical Therapy

## 2022-10-23 ENCOUNTER — Encounter (HOSPITAL_BASED_OUTPATIENT_CLINIC_OR_DEPARTMENT_OTHER): Payer: Self-pay | Admitting: Physical Therapy

## 2022-10-23 ENCOUNTER — Ambulatory Visit (HOSPITAL_BASED_OUTPATIENT_CLINIC_OR_DEPARTMENT_OTHER): Payer: Medicare Other | Admitting: Physical Therapy

## 2022-10-23 DIAGNOSIS — M79604 Pain in right leg: Secondary | ICD-10-CM

## 2022-10-23 DIAGNOSIS — M5459 Other low back pain: Secondary | ICD-10-CM | POA: Diagnosis not present

## 2022-10-23 DIAGNOSIS — M6281 Muscle weakness (generalized): Secondary | ICD-10-CM

## 2022-10-23 DIAGNOSIS — M79605 Pain in left leg: Secondary | ICD-10-CM

## 2022-10-23 NOTE — Therapy (Addendum)
OUTPATIENT PHYSICAL THERAPY TREATMENT NOTE PHYSICAL THERAPY DISCHARGE SUMMARY  Visits from Start of Care: 11  Current functional level related to goals / functional outcomes: Had improved at the time   Remaining deficits: unknown   Education / Equipment: Management of condition/HEP   Patient agrees to discharge. Patient goals were partially met. Patient is being discharged due to not returning since the last visit.  Addend Corrie Dandy Tomma Lightning) Ziemba MPT 07/24/23 9:36 AM [facility] [address] [place], [place], [address] Phone: [phone]   Fax:  [phone]    Patient Name: Judy Nguyen MRN: [id number] DOB:1956-03-17, 67 y.o., female Today's Date: 10/23/2022  END OF SESSION:  PT End of Session - 10/23/22 1404     Visit Number 11    Number of Visits 14    Date for PT Re-Evaluation 11/15/22    Authorization Type UHC MCR    PT Start Time 1400    PT Stop Time 1440    PT Time Calculation (min) 40 min    Activity Tolerance Patient tolerated treatment well    Behavior During Therapy [facility] for tasks assessed/performed                Past Medical History:  Diagnosis Date   Aortic atherosclerosis (HCC)    Asthma    Chronic back pain    Coronary artery calcification    Depression    Hyperlipidemia    Hypertension    Prediabetes    Past Surgical History:  Procedure Laterality Date   ABDOMINAL HYSTERECTOMY     WRIST FRACTURE SURGERY Right    Patient Active Problem List   Diagnosis Date Noted   Prediabetes 08/11/2021   Coronary artery calcification 08/11/2021   Aortic atherosclerosis (HCC) 08/11/2021   GERD (gastroesophageal reflux disease) 05/22/2019   Centrilobular emphysema (HCC) 05/22/2019   Healthcare maintenance 05/22/2019   Dyspnea 02/04/2019   Post-nasal drip 02/04/2019   Tobacco abuse 02/04/2019     REFERRING PROVIDER: Romero Belling, MD  REFERRING DIAG:  Lumbar stenosis; lumbago w/  radicular pain  Rationale for Evaluation and Treatment: Rehabilitation  THERAPY DIAG:  Other low back pain  Pain in left leg  Pain in right leg  Muscle weakness (generalized)  ONSET DATE: Chronic pain / Exacerbation in May 2023  SUBJECTIVE:                                                                                                                                                                                           SUBJECTIVE STATEMENT: Pt reports she rode back from [place] last night.  She    PERTINENT  HISTORY:  Per dx and L4-5 spondylolisthesis HTN  PAIN:  Are you having pain? Yes NPRS: 5-6/10 current, 20/10 worst, 4-5/10 best Location:  bilat lumbar sides down to distal calf L > R   Type:  sharp, achy, light burning.  She denies numbness in bilat LE's   PRECAUTIONS: Other: L4-5 spondylolisthesis  WEIGHT BEARING RESTRICTIONS: No  FALLS:  Has patient fallen in last 6 months? No  HOME ENVIRONMENT: Pt lives alone in a 1 story home.  Pt has 3 steps with a rail to enter home.   OCCUPATION: Pt is on disability retirement.  PLOF: Independent.  Pt has a hx of chronic lumbar pain which has affected her mobility and performance of daily activities.   PATIENT GOALS: to decrease pain.  To be able to do what she can and keep her mobility.    OBJECTIVE:   DIAGNOSTIC FINDINGS:  MRI in 2009 showed L4-5, L5-S1 multifactorial central stenosis, L4-5 mod to severe L subarticular recess stenosis, and L5-S1 subarticular lateral recess stenosis L > R with associated foraminal narrowing.  MRI in 2010 showed no change in stenosis at those levels including mild to mod L3-4 central canal stenosis.  Also showed broad based disc bulge.  Notes from [facility]:  Spine X-rays: L4-5, L5-S1 with facet arthrosis and L4-5 spondylolisthesis and MRI with central, lateral recess and foraminal stenosis L3-S1   PATIENT SURVEYS:  FOTO: Initial/Current: 44/53 with a goal of 52 at visit  12   POSTURE: slight lateral shift to Right which has improved since initial eval      LUMBAR ROM:    AROM eval 5/16  Flexion [facility] WFL with pain returning to extension  Extension     Right lateral flexion 75% with pain WFL without pain  Left lateral flexion [facility] WFL  Right rotation 50% with pain 50% without pain  Left rotation 50% 50%   (Blank rows = not tested)   LOWER EXTREMITY MMT:      Strength  Right eval Left eval Right 5/16 Left 5/16  Hip flexion 5/5 5/5 5/5 5/5  Hip extension        Hip abduction        Hip adduction        Hip internal rotation        Hip external rotation 4-/5 4/5 5/5 5/5  Knee flexion 4-/5 seated 4-/5 seated 4/5 seated 4-/5 seated  Knee extension 4+/5 4+/5 5/5 5/5  Ankle dorsiflexion        Ankle plantarflexion        Ankle inversion        Ankle eversion         (Blank rows = not tested)   GAIT: Assistive device utilized: None Level of assistance: Complete Independence Comments: Improved step length, decreased gait speed, limited pelvic rotation, and limited reciprocal arm swing.   TODAY'S TREATMENT:      Pt seen for aquatic therapy today.  Treatment took place in water 3.5  to approx 4. ft 6 inch depth at the [facility] pool. Temp of water was 91.  Pt entered/exited the pool via stairs independently with bilat rail.  *without support in 4+ ft of water: walking forward/ backward  with reciprocal arm swing x 1 lap * Side stepping with arm addct with rainbow hand floats x 2 laps * walking forward/backward with rainbow hand floats under the water at sides * standing horiz shoulder addct/abdct in staggered stance, then standard stance x 5  each; TrA set with short blue noodle pull down in all 3 positions * Warrior 1 x 5 reps each LE forward  * SLS with blue noodle (with hole) stomp x 10 slow/10 fast each LE without UE support * 3 way LE stretch with ankle supported by blue noodle (with hole) * cycling in deeper water with UE On  yellow hand floats/ straddling yellow noodle * staggered stance with kick board row x 10 each * L stretch with gentle wag the tail    Pt requires the buoyancy and hydrostatic pressure of water for support, and to offload joints by unweighting joint load by at least 50 % in navel deep water and by at least 75-80% in chest to neck deep water.  Viscosity of the water is needed for resistance of strengthening. Water current perturbations provides challenge to standing balance requiring increased core activation.     PATIENT EDUCATION:  Education details: exercise form and rationale, and modifications  Education method: Explanation, Demonstration, Verbal cues, Education comprehension: verbalized understanding, returned demonstration, verbal cues required, tactile cues required, and needs further education  HOME EXERCISE PROGRAM: Lateral shift correct exercise in standing with hands on wall, 2x/day, 2-5 reps with 10 sec hold  Access Code: [email or web address] URL: [email or web address] Date: 09/11/2022 Prepared by: Aaron Edelman  AQUATIC HEP Access Code: [email or web address] URL: [email or web address] Date: 10/16/2022  (not issued yet) Prepared by: [facility] [address] This aquatic home exercise program from [employer] utilizes pictures from land based exercises, but has been adapted prior to lamination and issuance.   ASSESSMENT:  CLINICAL IMPRESSION:   Trialed a few extra exercises with good tolerance today.  Pt may benefit from one additional aquatic visit to update HEP, then will be independent with aquatic exercises.  Pt is making good progress towards remaining goals. Pt should benefit from cont skilled PT services including aquatic therapy for improved pain, strength, function, tolerance to activity, and mobility and to establish independence with aquatic HEP.      OBJECTIVE IMPAIRMENTS: Abnormal gait, decreased activity tolerance, decreased endurance,  decreased mobility, difficulty walking, decreased ROM, decreased strength, hypomobility, impaired flexibility, postural dysfunction, and pain.   ACTIVITY LIMITATIONS: carrying, lifting, sitting, standing, bed mobility, and locomotion level  PARTICIPATION LIMITATIONS: meal prep, cleaning, shopping, and community activity  PERSONAL FACTORS: Time since onset of injury/illness/exacerbation are also affecting patient's functional outcome.   REHAB POTENTIAL: Good  CLINICAL DECISION MAKING: Stable/uncomplicated  EVALUATION COMPLEXITY: Low   GOALS:   SHORT TERM GOALS: Target date: 09/25/2022   Pt will be independent and compliant with HEP for improved pain, strength, ROM, and function.  Baseline: Goal status: GOAL MET  2.  Pt will report at least a 25% improvement in pain and sx's overall for improved mobility.  Baseline:  Goal status: GOAL MET  3.  Pt will be able to perform bed mobility without significant pain and difficulty  Baseline: Pt reports 60% better--5/9 Goal status: PROGRESSING  4.  Pt will be able to cook without significant pain and difficulty.  Baseline:  Goal status: PROGRESSING - 40-50% improved 5/14.  Target date:  10/02/2022    LONG TERM GOALS: Target date: 11/15/2022   Pt will be able to stand and shower without increased pain.  Baseline:  Goal status: PARTIALLY MET 5/14.   2.  Pt will be able to perform her normal household chores without significant pain.   Baseline: 50% improved  Goal status:PARTIALLY MET 5/14.  3.  Pt will demo improved bilat  Hip ER and knee flex and ext strength to 5/5 MMT for improved performance of and tolerance with functional mobility skills.  Baseline:  Goal status: PARTIALLY MET.  5/16  4.  Pt will ambulate her normal community distance including shopping in the grocery store without significant pain.  Baseline:  Goal status: ONGOING 5/16    PLAN:  PT FREQUENCY: 1x/wk    PT DURATION: 4 weeks  PLANNED  INTERVENTIONS: Therapeutic exercises, Therapeutic activity, Neuromuscular re-education, Balance training, Gait training, Patient/Family education, Self Care, Joint mobilization, Stair training, Aquatic Therapy, Dry Needling, Electrical stimulation, Cryotherapy, Moist heat, Taping, Ultrasound, Manual therapy, and Re-evaluation.  PLAN FOR NEXT SESSION:  Pt to continue with aquatic therapy.   Mayer Camel, PTA 10/23/22 6:14 PM [facility] [address] [place], [place], [address] Phone: [phone]   Fax:  [phone]

## 2022-10-25 ENCOUNTER — Encounter (HOSPITAL_BASED_OUTPATIENT_CLINIC_OR_DEPARTMENT_OTHER): Payer: Medicare Other | Admitting: Physical Therapy

## 2022-10-30 ENCOUNTER — Ambulatory Visit (HOSPITAL_BASED_OUTPATIENT_CLINIC_OR_DEPARTMENT_OTHER): Payer: Medicare Other | Admitting: Physical Therapy

## 2022-10-31 ENCOUNTER — Ambulatory Visit (HOSPITAL_BASED_OUTPATIENT_CLINIC_OR_DEPARTMENT_OTHER): Payer: Medicare Other | Admitting: Physical Therapy

## 2022-11-01 ENCOUNTER — Ambulatory Visit (HOSPITAL_BASED_OUTPATIENT_CLINIC_OR_DEPARTMENT_OTHER): Payer: Medicare Other | Admitting: Physical Therapy

## 2023-03-13 ENCOUNTER — Institutional Professional Consult (permissible substitution): Payer: Medicare Other | Admitting: Pulmonary Disease
